# Patient Record
Sex: Female | Born: 1995 | Race: Black or African American | Hispanic: No | Marital: Single | Smoking: Never smoker
Health system: Southern US, Community
[De-identification: ages and names within clinical notes are randomized; demographics above are authoritative.]

## PROBLEM LIST (undated history)

## (undated) ENCOUNTER — Inpatient Hospital Stay (HOSPITAL_COMMUNITY): Payer: Self-pay

## (undated) DIAGNOSIS — R51 Headache: Secondary | ICD-10-CM

## (undated) DIAGNOSIS — R519 Headache, unspecified: Secondary | ICD-10-CM

## (undated) DIAGNOSIS — R569 Unspecified convulsions: Secondary | ICD-10-CM

## (undated) DIAGNOSIS — F32A Depression, unspecified: Secondary | ICD-10-CM

## (undated) DIAGNOSIS — I499 Cardiac arrhythmia, unspecified: Secondary | ICD-10-CM

## (undated) DIAGNOSIS — F329 Major depressive disorder, single episode, unspecified: Secondary | ICD-10-CM

## (undated) DIAGNOSIS — F419 Anxiety disorder, unspecified: Secondary | ICD-10-CM

---

## 2015-07-26 NOTE — L&D Delivery Note (Signed)
Delivery Note At 6:02 PM a viable female was delivered via  (Presentation:vertex ; LOA ).  APGAR: , ; weight pending .   Placenta status:spontaneous ,shultz .  Cord: 3VC with the following complications:none .  Cord pH: pending  Anesthesia: none  Episiotomy:  none Lacerations:  1 degree laceration w/ no repair  Suture Repair: n/a Est. Blood Loss (mL):  100  Mom to postpartum.  Baby to Couplet care / Skin to Skin.  Sheila Villanueva 05/08/2016, 6:17 PM

## 2016-02-19 ENCOUNTER — Inpatient Hospital Stay (HOSPITAL_COMMUNITY)
Admission: AD | Admit: 2016-02-19 | Discharge: 2016-02-19 | Disposition: A | Discharge status: Home / Self Care | Payer: Medicaid Other | Source: Ambulatory Visit | Attending: Obstetrics & Gynecology | Admitting: Obstetrics & Gynecology

## 2016-02-19 ENCOUNTER — Encounter (HOSPITAL_COMMUNITY): Payer: Self-pay | Admitting: *Deleted

## 2016-02-19 DIAGNOSIS — Z3A32 32 weeks gestation of pregnancy: Secondary | ICD-10-CM | POA: Insufficient documentation

## 2016-02-19 DIAGNOSIS — M545 Low back pain: Secondary | ICD-10-CM | POA: Insufficient documentation

## 2016-02-19 DIAGNOSIS — Z888 Allergy status to other drugs, medicaments and biological substances status: Secondary | ICD-10-CM | POA: Insufficient documentation

## 2016-02-19 DIAGNOSIS — Z882 Allergy status to sulfonamides status: Secondary | ICD-10-CM | POA: Insufficient documentation

## 2016-02-19 DIAGNOSIS — M549 Dorsalgia, unspecified: Secondary | ICD-10-CM

## 2016-02-19 DIAGNOSIS — O26893 Other specified pregnancy related conditions, third trimester: Secondary | ICD-10-CM | POA: Diagnosis not present

## 2016-02-19 HISTORY — DX: Headache, unspecified: R51.9

## 2016-02-19 HISTORY — DX: Unspecified convulsions: R56.9

## 2016-02-19 HISTORY — DX: Major depressive disorder, single episode, unspecified: F32.9

## 2016-02-19 HISTORY — DX: Depression, unspecified: F32.A

## 2016-02-19 HISTORY — DX: Headache: R51

## 2016-02-19 HISTORY — DX: Anxiety disorder, unspecified: F41.9

## 2016-02-19 LAB — URINE MICROSCOPIC-ADD ON

## 2016-02-19 LAB — URINALYSIS, ROUTINE W REFLEX MICROSCOPIC
Bilirubin Urine: NEGATIVE
GLUCOSE, UA: NEGATIVE mg/dL
KETONES UR: NEGATIVE mg/dL
LEUKOCYTES UA: NEGATIVE
Nitrite: NEGATIVE
PH: 7 (ref 5.0–8.0)
Protein, ur: NEGATIVE mg/dL
Specific Gravity, Urine: 1.02 (ref 1.005–1.030)

## 2016-02-19 NOTE — Discharge Instructions (Signed)

## 2016-02-19 NOTE — MAU Provider Note (Signed)
  History     CSN: [id number]  Arrival date and time: 02/19/16 1301   None     Chief Complaint  Patient presents with  . Back Pain   HPI Sheila Villanueva is a Garnette Gunner @ 32.1wks who presents for eval of low abd pressure x this week and low back pain 'for a long time'. She denies leaking or bldg. No fever or dysuria. Reports +FM. No N/V/D. She has recently moved here from [place] and is working on establishing care here.  OB History    Gravida Para Term Preterm AB Living   1             SAB TAB Ectopic Multiple Live Births                  Past Medical History:  Diagnosis Date  . Anxiety    stopped medication on her own  . Depression   . Headache   . Seizures (HCC)    reports seizure like activity in July 2017    History reviewed. No pertinent surgical history.  Family History  Problem Relation Age of Onset  . Adopted: Yes    Social History  Substance Use Topics  . Smoking status: Never Smoker  . Smokeless tobacco: Never Used  . Alcohol use No    Allergies:  Allergies  Allergen Reactions  . Tetracyclines & Related Nausea And Vomiting  . Sulfa Antibiotics Rash    Prescriptions Prior to Admission  Medication Sig Dispense Refill Last Dose  . ferrous sulfate 325 (65 FE) MG tablet Take 325 mg by mouth daily with breakfast.   02/18/2016 at Unknown time  . Prenatal Vit-Fe Fumarate-FA (PRENATAL MULTIVITAMIN) TABS tablet Take 1 tablet by mouth daily at 12 noon.   02/18/2016 at Unknown time    ROS No other pertinents other than what is listed in HPI Physical Exam   Blood pressure 97/59, pulse 104, temperature 98.7 F (37.1 C), temperature source Oral, resp. rate 18.  Physical Exam  Constitutional: She is oriented to person, place, and time. She appears well-developed.  HENT:  Head: Normocephalic.  Neck: Normal range of motion.  Cardiovascular: Normal rate.   Respiratory: Effort normal.  GI:  EFM 145-150s, +accels no decels UI  Genitourinary: Vagina normal.   Genitourinary Comments: Cx C/L  Musculoskeletal: Normal range of motion.  Neurological: She is alert and oriented to person, place, and time.  Skin: Skin is warm and dry.  Psychiatric: She has a normal mood and affect. Her behavior is normal. Thought content normal.   Urinalysis    Component Value Date/Time   COLORURINE YELLOW 02/19/2016 1315   APPEARANCEUR CLEAR 02/19/2016 1315   LABSPEC 1.020 02/19/2016 1315   PHURINE 7.0 02/19/2016 1315   GLUCOSEU NEGATIVE 02/19/2016 1315   HGBUR MODERATE (A) 02/19/2016 1315   BILIRUBINUR NEGATIVE 02/19/2016 1315   KETONESUR NEGATIVE 02/19/2016 1315   PROTEINUR NEGATIVE 02/19/2016 1315   NITRITE NEGATIVE 02/19/2016 1315   LEUKOCYTESUR NEGATIVE 02/19/2016 1315   Micro: 6-30SE, many bacteria, mucous +  MAU Course  Procedures  MDM UA ordered NST read Cx exam  Assessment and Plan  IUP@32 .1wks Back pain in preg  D/C home Given number for WOC to call ASAP for care (bring previous records to that visit) Comfort meas rev'd  Cam Hai CNM 02/19/2016, 2:41 PM

## 2016-02-19 NOTE — MAU Note (Signed)
Low back pain and pressure for the past wk.  The pressure has become constant, the pressure comes nd goes.  No hx of PTL.

## 2016-03-24 ENCOUNTER — Encounter: Payer: Self-pay | Admitting: *Deleted

## 2016-04-12 ENCOUNTER — Encounter (HOSPITAL_COMMUNITY): Payer: Self-pay

## 2016-04-12 ENCOUNTER — Inpatient Hospital Stay (HOSPITAL_COMMUNITY)
Admission: AD | Admit: 2016-04-12 | Discharge: 2016-04-12 | Disposition: A | Discharge status: Home / Self Care | Payer: Medicaid Other | Source: Ambulatory Visit | Attending: Obstetrics and Gynecology | Admitting: Obstetrics and Gynecology

## 2016-04-12 DIAGNOSIS — O98819 Other maternal infectious and parasitic diseases complicating pregnancy, unspecified trimester: Secondary | ICD-10-CM

## 2016-04-12 DIAGNOSIS — O471 False labor at or after 37 completed weeks of gestation: Secondary | ICD-10-CM

## 2016-04-12 DIAGNOSIS — O0930 Supervision of pregnancy with insufficient antenatal care, unspecified trimester: Secondary | ICD-10-CM

## 2016-04-12 DIAGNOSIS — Z3A37 37 weeks gestation of pregnancy: Secondary | ICD-10-CM

## 2016-04-12 DIAGNOSIS — A749 Chlamydial infection, unspecified: Secondary | ICD-10-CM | POA: Clinically undetermined

## 2016-04-12 DIAGNOSIS — R7309 Other abnormal glucose: Secondary | ICD-10-CM

## 2016-04-12 DIAGNOSIS — O0933 Supervision of pregnancy with insufficient antenatal care, third trimester: Secondary | ICD-10-CM | POA: Diagnosis not present

## 2016-04-12 DIAGNOSIS — O479 False labor, unspecified: Secondary | ICD-10-CM

## 2016-04-12 LAB — URINALYSIS, ROUTINE W REFLEX MICROSCOPIC
Bilirubin Urine: NEGATIVE
GLUCOSE, UA: NEGATIVE mg/dL
KETONES UR: NEGATIVE mg/dL
Nitrite: NEGATIVE
PROTEIN: NEGATIVE mg/dL
Specific Gravity, Urine: 1.015 (ref 1.005–1.030)
pH: 6.5 (ref 5.0–8.0)

## 2016-04-12 LAB — URINE MICROSCOPIC-ADD ON

## 2016-04-12 LAB — RAPID URINE DRUG SCREEN, HOSP PERFORMED
Amphetamines: NOT DETECTED
BENZODIAZEPINES: NOT DETECTED
Barbiturates: NOT DETECTED
Cocaine: NOT DETECTED
Opiates: NOT DETECTED
Tetrahydrocannabinol: NOT DETECTED

## 2016-04-12 LAB — OB RESULTS CONSOLE GBS: STREP GROUP B AG: NEGATIVE

## 2016-04-12 NOTE — Discharge Instructions (Signed)

## 2016-04-12 NOTE — MAU Provider Note (Signed)
[facility]  [facility]   First Provider Initiated Contact with Patient 04/12/16 1805     HPI  HPI: Sheila Villanueva is a 20 y.o. G1P0 at 6237w2dwho presents to maternity admissions reporting lapse in prenatal care.  States is full term and wants to know what her cervix is doing. Had prenatal care in [place] until 2 months ago, records scanned.  No complaints, except some irregular, mild contractions.  Just wants to "be checked"  States has gotten two due dates.  Per record review, the one we are using is by a 7 week US, therefore accurate. She reports good fetal movement, denies LOF, vaginal bleeding, vaginal itching/burning, urinary symptoms, h/a, dizziness, n/v, diarrhea, constipation or fever/chills.  She denies headache, visual changes or RUQ abdominal pain.  RN Note: Pt moved here from [place], hasn't had any PNC since moving here 2 months ago.  Denies bleeding or LOF, has pelvic pressure & back pain.  Past Medical History: Past Medical History:  Diagnosis Date  . Anxiety    stopped medication on her own  . Depression   . Headache   . Seizures (HCC)    reports seizure like activity in July 2017    Past obstetric history: OB History  Gravida Para Term Preterm AB Living  1            SAB TAB Ectopic Multiple Live Births               # Outcome Date GA Lbr Len/2nd Weight Sex Delivery Anes PTL Lv  1 Current               Past Surgical History: History reviewed. No pertinent surgical history.  Family History: Family History  Problem Relation Age of Onset  . Adopted: Yes    Social History: Social History  Substance Use Topics  . Smoking status: Never Smoker  . Smokeless tobacco: Never Used  . Alcohol use No    Allergies:  Allergies  Allergen Reactions  . Tetracyclines & Related Nausea And Vomiting  . Sulfa Antibiotics Rash    Meds:  Prescriptions Prior to Admission  Medication Sig Dispense Refill Last Dose  . ferrous sulfate 325 (65 FE) MG tablet Take 325 mg by  mouth daily with breakfast.   02/18/2016 at Unknown time  . Prenatal Vit-Fe Fumarate-FA (PRENATAL MULTIVITAMIN) TABS tablet Take 1 tablet by mouth daily at 12 noon.   02/18/2016 at Unknown time    I have reviewed patient's Past Medical Hx, Surgical Hx, Family Hx, Social Hx, medications and allergies.   ROS:  Review of Systems  Constitutional: Negative for chills and fever.  Gastrointestinal: Negative for abdominal pain, constipation, diarrhea, nausea and vomiting.  Genitourinary: Negative for vaginal bleeding and vaginal discharge.  Musculoskeletal: Negative for back pain.  Neurological: Negative for dizziness.   Other systems negative  Physical Exam  Patient Vitals for the past 24 hrs:  BP Temp Temp src Pulse Resp  04/12/16 1710 120/64 98.4 F (36.9 C) Oral 116 16   Constitutional: Well-developed, well-nourished female in no acute distress.  Cardiovascular: normal rate and rhythm Respiratory: normal effort, clear to auscultation bilaterally GI: Abd soft, non-tender, gravid appropriate for gestational age.   No rebound or guarding. MS: Extremities nontender, no edema, normal ROM Neurologic: Alert and oriented x 4.  GU: Neg CVAT.   Dilation: Closed Effacement (%): Thick Cervical Position: Posterior Station: Ballotable Presentation: Vertex Exam by:: Mayford KnifeWilliams CNM     FHT:  Baseline 140 ,  moderate variability, accelerations present, no decelerations Contractions: Irregular     Labs: Results for orders placed or performed during the hospital encounter of 04/12/16 (from the past 72 hour(s))  Urinalysis, Routine w reflex microscopic (not at [facility])     Status: Abnormal   Collection Time: 04/12/16  5:15 PM  Result Value Ref Range   Color, Urine YELLOW YELLOW   APPearance CLEAR CLEAR   Specific Gravity, Urine 1.015 1.005 - 1.030   pH 6.5 5.0 - 8.0   Glucose, UA NEGATIVE NEGATIVE mg/dL   Hgb urine dipstick MODERATE (A) NEGATIVE   Bilirubin Urine NEGATIVE NEGATIVE   Ketones, ur  NEGATIVE NEGATIVE mg/dL   Protein, ur NEGATIVE NEGATIVE mg/dL   Nitrite NEGATIVE NEGATIVE   Leukocytes, UA SMALL (A) NEGATIVE  Urine rapid drug screen (hosp performed)     Status: None   Collection Time: 04/12/16  5:15 PM  Result Value Ref Range   Opiates NONE DETECTED NONE DETECTED   Cocaine NONE DETECTED NONE DETECTED   Benzodiazepines NONE DETECTED NONE DETECTED   Amphetamines NONE DETECTED NONE DETECTED   Tetrahydrocannabinol NONE DETECTED NONE DETECTED   Barbiturates NONE DETECTED NONE DETECTED  Urine microscopic-add on     Status: Abnormal   Collection Time: 04/12/16  5:15 PM  Result Value Ref Range   Squamous Epithelial / LPF 0-5 (A) NONE SEEN   WBC, UA 0-5 0 - 5 WBC/hpf   RBC / HPF 0-5 0 - 5 RBC/hpf   Bacteria, UA RARE (A) NONE SEEN  Culture, beta strep (group b only)     Status: None (Preliminary result)   Collection Time: 04/12/16  6:39 PM  Result Value Ref Range   Specimen Description VAGINAL/RECTAL    Special Requests NONE    Culture      CULTURE REINCUBATED FOR BETTER GROWTH Performed at [facility]    Report Status PENDING     Imaging:  No results found.  MAU Course/MDM: I have ordered labs and reviewed results.  NST reviewed GBS collected since she is so close to term Probably too late to get 3 hour GTT.  Per record review, glucola was 137, no evidence of 3 hr test  Assessment: SIUP at [redacted]w[redacted]d Lapse in prenatal care Reassuring FHR  Tracing, Category I Plan: Discharge home Labor precautions and fetal kick counts Follow up in Office for prenatal visits and recheck at [facility] (message sent)   Pt stable at time of discharge. Encouraged to return here or to other Urgent Care/ED if she develops worsening of symptoms, increase in pain, fever, or other concerning symptoms.     Wynelle Bourgeois CNM, MSN Certified Nurse-Midwife 04/12/2016 6:05 PM

## 2016-04-12 NOTE — MAU Note (Signed)
Pt moved here from [place], hasn't had any PNC since moving here 2 months ago.  Denies bleeding or LOF, has pelvic pressure & back pain.

## 2016-04-13 ENCOUNTER — Telehealth: Payer: Self-pay

## 2016-04-13 ENCOUNTER — Encounter (HOSPITAL_COMMUNITY): Payer: Self-pay | Admitting: Advanced Practice Midwife

## 2016-04-13 NOTE — Telephone Encounter (Signed)
Left message for patient to call our [place] office to schedule OB appointment. Armandina StammerJennifer Howard RNBSN

## 2016-04-14 LAB — CULTURE, BETA STREP (GROUP B ONLY)

## 2016-05-03 ENCOUNTER — Inpatient Hospital Stay (HOSPITAL_COMMUNITY)
Admission: AD | Admit: 2016-05-03 | Discharge: 2016-05-03 | Disposition: A | Discharge status: Home / Self Care | Payer: Medicaid Other | Source: Ambulatory Visit | Attending: Obstetrics and Gynecology | Admitting: Obstetrics and Gynecology

## 2016-05-03 ENCOUNTER — Encounter (HOSPITAL_COMMUNITY): Payer: Self-pay

## 2016-05-03 DIAGNOSIS — R109 Unspecified abdominal pain: Secondary | ICD-10-CM | POA: Diagnosis not present

## 2016-05-03 DIAGNOSIS — O9989 Other specified diseases and conditions complicating pregnancy, childbirth and the puerperium: Secondary | ICD-10-CM | POA: Diagnosis not present

## 2016-05-03 DIAGNOSIS — O0933 Supervision of pregnancy with insufficient antenatal care, third trimester: Secondary | ICD-10-CM | POA: Diagnosis not present

## 2016-05-03 DIAGNOSIS — R7309 Other abnormal glucose: Secondary | ICD-10-CM

## 2016-05-03 DIAGNOSIS — O1203 Gestational edema, third trimester: Secondary | ICD-10-CM | POA: Insufficient documentation

## 2016-05-03 DIAGNOSIS — Z3A40 40 weeks gestation of pregnancy: Secondary | ICD-10-CM | POA: Insufficient documentation

## 2016-05-03 NOTE — MAU Provider Note (Signed)
History     CSN: [id number]  Arrival date and time: 05/03/16 1742      Chief Complaint  Patient presents with  . Abdominal Pain   HPI  A 20 year old G1 P0 at 2040 and 2 x 7 week ultrasound who presents today with concerns for preeclampsia after googling her symptoms. Patient [facility] until [facility] [place]. She is since attempted to transfer care here but has been unable to establish with the physician. All of her records have been scanned into the computer.  Her concerning symptoms today were swelling and a remote event of questionable seizure in July. She has had no similar episodes since then. She states she was seen at the hospital, but they are unsure if she had a seizure. She was just given fluids and sent home. She has no chronic medical problems.   OB History    Gravida Para Term Preterm AB Living   1             SAB TAB Ectopic Multiple Live Births                  Past Medical History:  Diagnosis Date  . Anxiety    stopped medication on her own  . Depression   . Headache   . Seizures (HCC)    reports seizure like activity in July 2017    History reviewed. No pertinent surgical history.  Family History  Problem Relation Age of Onset  . Adopted: Yes    Social History  Substance Use Topics  . Smoking status: Never Smoker  . Smokeless tobacco: Never Used  . Alcohol use No    Allergies:  Allergies  Allergen Reactions  . Tetracyclines & Related Nausea And Vomiting  . Sulfa Antibiotics Rash    Prescriptions Prior to Admission  Medication Sig Dispense Refill Last Dose  . ferrous sulfate 325 (65 FE) MG tablet Take 325 mg by mouth daily with breakfast.   02/18/2016 at Unknown time  . Prenatal Vit-Fe Fumarate-FA (PRENATAL MULTIVITAMIN) TABS tablet Take 1 tablet by mouth daily at 12 noon.   02/18/2016 at Unknown time    Review of Systems  Constitutional: Negative for chills and fever.  HENT: Negative for  congestion.   Eyes: Negative for blurred vision, double vision and photophobia.  Respiratory: Negative for cough and hemoptysis.   Cardiovascular: Negative for chest pain and palpitations.  Gastrointestinal: Negative for abdominal pain, heartburn, nausea and vomiting.  Genitourinary: Negative for dysuria, frequency and urgency.  Musculoskeletal: Negative for back pain, myalgias and neck pain.  Skin: Negative for itching and rash.  Neurological: Negative for dizziness, tingling, tremors and headaches.  Endo/Heme/Allergies: Negative for environmental allergies. Does not bruise/bleed easily.  Psychiatric/Behavioral: Negative for depression and suicidal ideas.   Physical Exam   Blood pressure 111/60, pulse 117, temperature 98.4 F (36.9 C), resp. rate 16, last menstrual period 07/09/2015.  Physical Exam  Constitutional: She is oriented to person, place, and time. She appears well-developed and well-nourished.  Cardiovascular: Normal rate and intact distal pulses.   Respiratory: Effort normal and breath sounds normal. No respiratory distress.  GI: Soft. Bowel sounds are normal. She exhibits no distension. There is no tenderness. There is no rebound.  gravid  Genitourinary:  Genitourinary Comments: Cervix 3/50/-3  Neurological: She is alert and oriented to person, place, and time.  Skin: Skin is warm and dry.   Reactive NST Baseline HR around 145-150 throughout her stay.  MAU Course  Procedures  MDM IN the MAU pt was monitored. BP was WNL. Patient was reassured that she had no significant concern for pre-eclampsia and she was discharged home.   Assessment and Plan  Limited prenatal care: records reviewed. No concerns. Plan for induction at 41 wks. Induction shceduled for 05/08/2016  Ernestina Penna 05/03/2016, 6:43 PM

## 2016-05-03 NOTE — Discharge Instructions (Signed)

## 2016-05-03 NOTE — MAU Note (Signed)
Having abdominal pain and pressure. Has not had care since she moved here in July, has tried to make appointments but has not been able to get into any clinics. Had a seizure in July. Has had some headaches and visual changes recently.

## 2016-05-06 ENCOUNTER — Encounter (HOSPITAL_COMMUNITY): Payer: Self-pay | Admitting: *Deleted

## 2016-05-06 ENCOUNTER — Inpatient Hospital Stay (HOSPITAL_COMMUNITY)
Admission: AD | Admit: 2016-05-06 | Discharge: 2016-05-06 | Disposition: A | Discharge status: Home / Self Care | Payer: Medicaid Other | Source: Ambulatory Visit | Attending: Obstetrics & Gynecology | Admitting: Obstetrics & Gynecology

## 2016-05-06 ENCOUNTER — Telehealth (HOSPITAL_COMMUNITY): Payer: Self-pay | Admitting: *Deleted

## 2016-05-06 DIAGNOSIS — Z3A41 41 weeks gestation of pregnancy: Secondary | ICD-10-CM | POA: Insufficient documentation

## 2016-05-06 DIAGNOSIS — R7309 Other abnormal glucose: Secondary | ICD-10-CM

## 2016-05-06 DIAGNOSIS — Z3493 Encounter for supervision of normal pregnancy, unspecified, third trimester: Secondary | ICD-10-CM | POA: Diagnosis not present

## 2016-05-06 NOTE — Discharge Instructions (Signed)
Braxton Hicks Contractions °Contractions of the uterus can occur throughout pregnancy. Contractions are not always a sign that you are in labor.  °WHAT ARE BRAXTON HICKS CONTRACTIONS?  °Contractions that occur before labor are called Braxton Hicks contractions, or false labor. Toward the end of pregnancy (32-34 weeks), these contractions can develop more often and may become more forceful. This is not true labor because these contractions do not result in opening (dilatation) and thinning of the cervix. They are sometimes difficult to tell apart from true labor because these contractions can be forceful and people have different pain tolerances. You should not feel embarrassed if you go to the hospital with false labor. Sometimes, the only way to tell if you are in true labor is for your health care provider to look for changes in the cervix. °If there are no prenatal problems or other health problems associated with the pregnancy, it is completely safe to be sent home with false labor and await the onset of true labor. °HOW CAN YOU TELL THE DIFFERENCE BETWEEN TRUE AND FALSE LABOR? °False Labor °· The contractions of false labor are usually shorter and not as hard as those of true labor.   °· The contractions are usually irregular.   °· The contractions are often felt in the front of the lower abdomen and in the groin.   °· The contractions may go away when you walk around or change positions while lying down.   °· The contractions get weaker and are shorter lasting as time goes on.   °· The contractions do not usually become progressively stronger, regular, and closer together as with true labor.   °True Labor °· Contractions in true labor last 30-70 seconds, become very regular, usually become more intense, and increase in frequency.   °· The contractions do not go away with walking.   °· The discomfort is usually felt in the top of the uterus and spreads to the lower abdomen and low back.   °· True labor can be  determined by your health care provider with an exam. This will show that the cervix is dilating and getting thinner.   °WHAT TO REMEMBER °· Keep up with your usual exercises and follow other instructions given by your health care provider.   °· Take medicines as directed by your health care provider.   °· Keep your regular prenatal appointments.   °· Eat and drink lightly if you think you are going into labor.   °· If Braxton Hicks contractions are making you uncomfortable:   °¨ Change your position from lying down or resting to walking, or from walking to resting.   °¨ Sit and rest in a tub of warm water.   °¨ Drink 2-3 glasses of water. Dehydration may cause these contractions.   °¨ Do slow and deep breathing several times an hour.   °WHEN SHOULD I SEEK IMMEDIATE MEDICAL CARE? °Seek immediate medical care if: °· Your contractions become stronger, more regular, and closer together.   °· You have fluid leaking or gushing from your vagina.   °· You have a fever.   °· You pass blood-tinged mucus.   °· You have vaginal bleeding.   °· You have continuous abdominal pain.   °· You have low back pain that you never had before.   °· You feel your baby's head pushing down and causing pelvic pressure.   °· Your baby is not moving as much as it used to.   °  °This information is not intended to replace advice given to you by your health care provider. Make sure you discuss any questions you have with your health care   provider. °  °Document Released: 07/11/2005 Document Revised: 07/16/2013 Document Reviewed: 04/22/2013 °Elsevier Interactive Patient Education ©2016 [employer] ° °

## 2016-05-06 NOTE — Telephone Encounter (Signed)
Preadmission screen  

## 2016-05-06 NOTE — MAU Note (Signed)
PT  SAYS    SHE TRANSFERRED  FROM [facility].     ON  2ND  WEEK OF  July  -   SHE  TRIED  TO GET PNC-  BUT  WHEN  SHE  CALLED  AROUND  NOONE  WOULD  TAKE  HER.  SHE  DOES  NOT HAVE  RECORDS  WITH  HER .    SHE WAS IN MAU    2-3  DAYS.   SHE IS SCH FOR AN INDUCTION ON  Sunday  AM  - AT 0800.      LAST MAU VISIT     3  CM.       DENIES HSV AND MRSA.

## 2016-05-06 NOTE — MAU Note (Signed)
Urine sent to lab 

## 2016-05-08 ENCOUNTER — Inpatient Hospital Stay (HOSPITAL_COMMUNITY)
Admission: AD | Admit: 2016-05-08 | Discharge: 2016-05-10 | DRG: 775 | Disposition: A | Discharge status: Home / Self Care | Payer: Medicaid Other | Source: Ambulatory Visit | Attending: Obstetrics and Gynecology | Admitting: Obstetrics and Gynecology

## 2016-05-08 ENCOUNTER — Inpatient Hospital Stay (HOSPITAL_COMMUNITY): Admit: 2016-05-08 | Payer: Medicaid Other | Attending: Obstetrics and Gynecology | Admitting: Obstetrics and Gynecology

## 2016-05-08 ENCOUNTER — Encounter (HOSPITAL_COMMUNITY): Payer: Self-pay

## 2016-05-08 DIAGNOSIS — O480 Post-term pregnancy: Secondary | ICD-10-CM | POA: Diagnosis present

## 2016-05-08 DIAGNOSIS — Z8759 Personal history of other complications of pregnancy, childbirth and the puerperium: Secondary | ICD-10-CM

## 2016-05-08 DIAGNOSIS — Z3A41 41 weeks gestation of pregnancy: Secondary | ICD-10-CM

## 2016-05-08 DIAGNOSIS — R7309 Other abnormal glucose: Secondary | ICD-10-CM

## 2016-05-08 HISTORY — DX: Cardiac arrhythmia, unspecified: I49.9

## 2016-05-08 LAB — TYPE AND SCREEN
ABO/RH(D): O POS
Antibody Screen: NEGATIVE

## 2016-05-08 LAB — RAPID URINE DRUG SCREEN, HOSP PERFORMED
Amphetamines: NOT DETECTED
BARBITURATES: NOT DETECTED
BENZODIAZEPINES: NOT DETECTED
Cocaine: NOT DETECTED
Opiates: NOT DETECTED
Tetrahydrocannabinol: NOT DETECTED

## 2016-05-08 LAB — CBC
HCT: 32.2 % — ABNORMAL LOW (ref 36.0–46.0)
HEMOGLOBIN: 10.6 g/dL — AB (ref 12.0–15.0)
MCH: 28.8 pg (ref 26.0–34.0)
MCHC: 32.9 g/dL (ref 30.0–36.0)
MCV: 87.5 fL (ref 78.0–100.0)
Platelets: 232 10*3/uL (ref 150–400)
RBC: 3.68 MIL/uL — AB (ref 3.87–5.11)
RDW: 15.3 % (ref 11.5–15.5)
WBC: 8.5 10*3/uL (ref 4.0–10.5)

## 2016-05-08 LAB — ABO/RH: ABO/RH(D): O POS

## 2016-05-08 LAB — HEPATITIS B SURFACE ANTIGEN: HEP B S AG: NEGATIVE

## 2016-05-08 MED ORDER — HYDROXYZINE HCL 50 MG PO TABS
50.0000 mg | ORAL_TABLET | Freq: Four times a day (QID) | ORAL | Status: DC | PRN
  Filled 2016-05-08: qty 1

## 2016-05-08 MED ORDER — DIPHENHYDRAMINE HCL 25 MG PO CAPS: 25.0000 mg | ORAL_CAPSULE | Freq: Four times a day (QID) | ORAL | Status: DC | PRN

## 2016-05-08 MED ORDER — ONDANSETRON HCL 4 MG PO TABS: 4.0000 mg | ORAL_TABLET | ORAL | Status: DC | PRN

## 2016-05-08 MED ORDER — ONDANSETRON HCL 4 MG/2ML IJ SOLN
4.0000 mg | Freq: Four times a day (QID) | INTRAMUSCULAR | Status: DC | PRN
  Administered 2016-05-08: 4 mg via INTRAVENOUS
  Filled 2016-05-08: qty 2

## 2016-05-08 MED ORDER — ZOLPIDEM TARTRATE 5 MG PO TABS: 5.0000 mg | ORAL_TABLET | Freq: Every evening | ORAL | Status: DC | PRN

## 2016-05-08 MED ORDER — ACETAMINOPHEN 325 MG PO TABS: 650.0000 mg | ORAL_TABLET | ORAL | Status: DC | PRN

## 2016-05-08 MED ORDER — PRENATAL MULTIVITAMIN CH
1.0000 | ORAL_TABLET | Freq: Every day | ORAL | Status: DC
  Administered 2016-05-09: 1 via ORAL
  Filled 2016-05-08: qty 1

## 2016-05-08 MED ORDER — IBUPROFEN 600 MG PO TABS
600.0000 mg | ORAL_TABLET | Freq: Four times a day (QID) | ORAL | Status: DC
  Administered 2016-05-08 – 2016-05-10 (×7): 600 mg via ORAL
  Filled 2016-05-08 (×7): qty 1

## 2016-05-08 MED ORDER — SIMETHICONE 80 MG PO CHEW: 80.0000 mg | CHEWABLE_TABLET | ORAL | Status: DC | PRN

## 2016-05-08 MED ORDER — TETANUS-DIPHTH-ACELL PERTUSSIS 5-2.5-18.5 LF-MCG/0.5 IM SUSP: 0.5000 mL | Freq: Once | INTRAMUSCULAR | Status: DC

## 2016-05-08 MED ORDER — LACTATED RINGERS IV SOLN: 500.0000 mL | INTRAVENOUS | Status: DC | PRN

## 2016-05-08 MED ORDER — ONDANSETRON HCL 4 MG/2ML IJ SOLN
4.0000 mg | INTRAMUSCULAR | Status: DC | PRN
Start: 2016-05-08 — End: 2016-05-10

## 2016-05-08 MED ORDER — SODIUM CHLORIDE 0.9% FLUSH: 3.0000 mL | INTRAVENOUS | Status: DC | PRN

## 2016-05-08 MED ORDER — OXYCODONE-ACETAMINOPHEN 5-325 MG PO TABS
1.0000 | ORAL_TABLET | ORAL | Status: DC | PRN
Start: 2016-05-08 — End: 2016-05-08

## 2016-05-08 MED ORDER — SENNOSIDES-DOCUSATE SODIUM 8.6-50 MG PO TABS
2.0000 | ORAL_TABLET | ORAL | Status: DC
  Administered 2016-05-09: 2 via ORAL
  Filled 2016-05-08 (×2): qty 2

## 2016-05-08 MED ORDER — MEASLES, MUMPS & RUBELLA VAC ~~LOC~~ INJ
0.5000 mL | INJECTION | Freq: Once | SUBCUTANEOUS | Status: DC
  Filled 2016-05-08: qty 0.5

## 2016-05-08 MED ORDER — OXYTOCIN 10 UNIT/ML IJ SOLN: 10.0000 [IU] | Freq: Once | INTRAMUSCULAR | Status: DC

## 2016-05-08 MED ORDER — OXYTOCIN BOLUS FROM INFUSION: 500.0000 mL | Freq: Once | INTRAVENOUS | Status: DC

## 2016-05-08 MED ORDER — OXYTOCIN 40 UNITS IN LACTATED RINGERS INFUSION - SIMPLE MED: 2.5000 [IU]/h | INTRAVENOUS | Status: DC

## 2016-05-08 MED ORDER — FLEET ENEMA 7-19 GM/118ML RE ENEM: 1.0000 | ENEMA | Freq: Every day | RECTAL | Status: DC | PRN

## 2016-05-08 MED ORDER — OXYTOCIN 40 UNITS IN LACTATED RINGERS INFUSION - SIMPLE MED
1.0000 m[IU]/min | INTRAVENOUS | Status: DC
  Administered 2016-05-08: 2 m[IU]/min via INTRAVENOUS
  Filled 2016-05-08: qty 1000

## 2016-05-08 MED ORDER — LACTATED RINGERS IV SOLN
INTRAVENOUS | Status: DC
  Administered 2016-05-08 (×2): via INTRAVENOUS

## 2016-05-08 MED ORDER — SODIUM CHLORIDE 0.9% FLUSH: 3.0000 mL | Freq: Two times a day (BID) | INTRAVENOUS | Status: DC

## 2016-05-08 MED ORDER — BENZOCAINE-MENTHOL 20-0.5 % EX AERO: 1.0000 "application " | INHALATION_SPRAY | CUTANEOUS | Status: DC | PRN

## 2016-05-08 MED ORDER — MISOPROSTOL 200 MCG PO TABS
ORAL_TABLET | ORAL | Status: AC
  Filled 2016-05-08: qty 4

## 2016-05-08 MED ORDER — LIDOCAINE HCL (PF) 1 % IJ SOLN
30.0000 mL | INTRAMUSCULAR | Status: DC | PRN
  Filled 2016-05-08: qty 30

## 2016-05-08 MED ORDER — FENTANYL CITRATE (PF) 100 MCG/2ML IJ SOLN
50.0000 ug | INTRAMUSCULAR | Status: DC | PRN
  Administered 2016-05-08 (×2): 100 ug via INTRAVENOUS
  Filled 2016-05-08 (×2): qty 2

## 2016-05-08 MED ORDER — ACETAMINOPHEN 325 MG PO TABS
650.0000 mg | ORAL_TABLET | ORAL | Status: DC | PRN
  Administered 2016-05-09 – 2016-05-10 (×3): 650 mg via ORAL
  Filled 2016-05-08 (×3): qty 2

## 2016-05-08 MED ORDER — OXYCODONE-ACETAMINOPHEN 5-325 MG PO TABS: 2.0000 | ORAL_TABLET | ORAL | Status: DC | PRN

## 2016-05-08 MED ORDER — SOD CITRATE-CITRIC ACID 500-334 MG/5ML PO SOLN: 30.0000 mL | ORAL | Status: DC | PRN

## 2016-05-08 MED ORDER — SODIUM CHLORIDE 0.9 % IV SOLN: 250.0000 mL | INTRAVENOUS | Status: DC | PRN

## 2016-05-08 MED ORDER — TERBUTALINE SULFATE 1 MG/ML IJ SOLN
0.2500 mg | Freq: Once | INTRAMUSCULAR | Status: DC | PRN
  Filled 2016-05-08: qty 1

## 2016-05-08 MED ORDER — COCONUT OIL OIL: 1.0000 "application " | TOPICAL_OIL | Status: DC | PRN

## 2016-05-08 MED ORDER — MISOPROSTOL 200 MCG PO TABS
800.0000 ug | ORAL_TABLET | Freq: Once | ORAL | Status: AC
  Administered 2016-05-08: 800 ug via RECTAL

## 2016-05-08 NOTE — Progress Notes (Addendum)
Holland FallingKendra E Villanueva is a 20 y.o. G1P0 at 662w0d by ultrasound admitted for induction of labor due to Post dates. Due date 05/01/16.  Subjective:   Objective: BP 129/90   Pulse 84   Temp 98.1 F (36.7 C) (Oral)   Resp (!) 24   Ht 5\' 6"  (1.676 m)   Wt 240 lb (108.9 kg)   LMP 07/09/2015   SpO2 97%   BMI 38.74 kg/m  No intake/output data recorded. No intake/output data recorded.  FHT:  FHR: 100-120's bpm, variability: moderate,  accelerations:  Present,  decelerations:  Present prolonged deccel noted x 4 mins  UC:   regular, every 2-4 minutes SVE:   Dilation: 9 Effacement (%): 90 Station: 0, +1 Exam by:: Marlynn Perking. Sheila Villanueva, CNM  Labs: Lab Results  Component Value Date   WBC 8.5 05/08/2016   HGB 10.6 (L) 05/08/2016   HCT 32.2 (L) 05/08/2016   MCV 87.5 05/08/2016   PLT 232 05/08/2016    Assessment / Plan: Induction of labor due to postterm,  progressing well on pitocin  Labor: Progressing normally; foul smelling odor noted to fluid, thick mec  Preeclampsia:  no signs or symptoms of toxicity, intake and ouput balanced and labs stable Fetal Wellbeing:  Category II Pain Control:  IV pain meds I/D:  n/a Anticipated MOD:  NSVD  Sheila Villanueva 05/08/2016, 2:05 PM

## 2016-05-08 NOTE — H&P (Signed)
Holland FallingKendra E Capobianco is a 20 y.o. female  @ 41.0 weeks G1P0 presenting for IOL. OB History    Gravida Para Term Preterm AB Living   1             SAB TAB Ectopic Multiple Live Births                 Past Medical History:  Diagnosis Date  . Anxiety    stopped medication on her own  . Depression   . Dysrhythmia    pt states prolonged QT in past  . Headache   . Seizures (HCC)    reports seizure like activity in July 2017   History reviewed. No pertinent surgical history. Family History: family history is not on file. She was adopted. Social History:  reports that she has never smoked. She has never used smokeless tobacco. She reports that she does not drink alcohol or use drugs.     Maternal Diabetes: No Genetic Screening: Normal Maternal Ultrasounds/Referrals: Normal Fetal Ultrasounds or other Referrals:  None Maternal Substance Abuse:  No Significant Maternal Medications:  None Significant Maternal Lab Results:  None Other Comments:  None  ROS Maternal Medical History:  Reason for admission: IOL for postdates  Fetal activity: Perceived fetal activity is normal.   Last perceived fetal movement was within the past hour.    Prenatal complications: no prenatal complications Prenatal Complications - Diabetes: none.    Dilation: 4.5 Effacement (%): 50 Station: -2 Exam by:: Renetta ChalkAshley Ellis, SNM  Blood pressure 129/90, pulse 93, temperature 98.3 F (36.8 C), temperature source Oral, resp. rate 18, height 5\' 6"  (1.676 m), weight 240 lb (108.9 kg), last menstrual period 07/09/2015. Maternal Exam:  Abdomen: Patient reports no abdominal tenderness. Estimated fetal weight is 7-5.   Fetal presentation: vertex  Introitus: Normal vulva. Normal vagina.  Ferning test: not done.  Nitrazine test: not done. Amniotic fluid character: not assessed.  Pelvis: adequate for delivery.   Cervix: Cervix evaluated by digital exam.     Fetal Exam Fetal Monitor Review: Mode: ultrasound.    Variability: moderate (6-25 bpm).   Pattern: accelerations present and no decelerations.    Fetal State Assessment: Category I - tracings are normal.     Physical Exam  Constitutional: She is oriented to person, place, and time. She appears well-developed and well-nourished.  HENT:  Head: Normocephalic.  Eyes: Conjunctivae are normal. Pupils are equal, round, and reactive to light.  Neck: Normal range of motion. Neck supple.  Cardiovascular: Normal rate and regular rhythm.   Respiratory: Effort normal and breath sounds normal.  GI: Soft. Bowel sounds are normal.  Genitourinary: Vagina normal and uterus normal.  Musculoskeletal: Normal range of motion.  Neurological: She is alert and oriented to person, place, and time. She has normal reflexes.  Skin: Skin is warm and dry.  Psychiatric: She has a normal mood and affect. Her behavior is normal. Judgment and thought content normal.    Prenatal labs: ABO, Rh: --/--/O POS (10/15 1028) Antibody: PENDING (10/15 1028) Rubella:   RPR:    HBsAg:    HIV:    GBS:     Assessment/Plan: Preg @ 41.0 wks, G1P0, IOL for postdates, SVe 4-5/80/-1,-2; will start oxytocin and then AROM   Wyvonnia DuskyMarie Maryhelen Lindler 05/08/2016, 11:03 AM

## 2016-05-08 NOTE — Anesthesia Pain Management Evaluation Note (Signed)
  CRNA Pain Management Visit Note  Patient: Sheila FallingKendra E Staver, 20 y.o., female  "Hello I am a member of the anesthesia team at [facility]. We have an anesthesia team available at all times to provide care throughout the hospital, including epidural management and anesthesia for C-section. I don't know your plan for the delivery whether it a natural birth, water birth, IV sedation, nitrous supplementation, doula or epidural, but we want to meet your pain goals."   1.Was your pain managed to your expectations on prior hospitalizations?   No prior hospitalizations  2.What is your expectation for pain management during this hospitalization?     Labor support without medications  3.How can we help you reach that goal? natural  Record the patient's initial score and the patient's pain goal.   Pain: 10  Pain Goal: 10 [facility] wants you to be able to say your pain was always managed very well.  Rica RecordsICKELTON,Chelsey Kimberley 05/08/2016

## 2016-05-08 NOTE — Consult Note (Signed)
[facility]  Delivery Note:  SVD  05/08/2016  5:52 PM  I was called to the delivery room at the request of the patient's obstetrician Wyvonnia Dusky(Marie Lawson) for MSAF and fetal heart rate decelerations.  PRENATAL HX:  This is a 20 y/o G1P0 at 941 and 0/[redacted] weeks gestation who was admitted today for IOL due to post dates.  No reported pregnancy complications.  AROM x 6.5 hours with thick meconium.  She is GBS negative.      DELIVERY:  Infant had poor tone at delivery with no respiratory effort and HR < 60 bpm.  He did not respond to standard warming, drying and stimulation so after 30 seconds PPV was initiated after oral and nasal suctioning.  He responded to PPV with immediate improvement in HR.  After 30 seconds he was breathing spontaneously with good tone and cry.  Delee suction was performed x1 with only scant amount of return.  A pulse oximeter was applied and O2 saturations remained in target range.  By 5 minutes of age, O2 saturations were 95% in RA.  APGARs 1 and 9.  Exam within normal limits.  After 5 minutes, baby left with nurse to assist parents with skin-to-skin care.   _____________________ Electronically Signed By: Maryan CharLindsey Jaycion Treml, MD Neonatologist

## 2016-05-08 NOTE — Progress Notes (Signed)
Camelia EngK. Rhianna Raulerson, RN took over care of pt

## 2016-05-09 LAB — RPR: RPR: NONREACTIVE

## 2016-05-09 LAB — RUBELLA SCREEN: RUBELLA: 2.33 {index} (ref >0.99)

## 2016-05-09 LAB — HIV ANTIBODY (ROUTINE TESTING W REFLEX): HIV Screen 4th Generation wRfx: NONREACTIVE

## 2016-05-09 NOTE — Progress Notes (Signed)
Post Partum Day 1 Subjective: no complaints, up ad lib, voiding and tolerating PO  Objective: Blood pressure 123/66, pulse 96, temperature 98.8 F (37.1 C), resp. rate 18, height 5\' 6"  (1.676 m), weight 240 lb (108.9 kg), last menstrual period 07/09/2015, SpO2 97 %, unknown if currently breastfeeding.  Physical Exam:  General: alert, cooperative, appears stated age and no distress Lochia: appropriate Uterine Fundus: firm Incision: n/a DVT Evaluation: No evidence of DVT seen on physical exam.   Recent Labs  05/08/16 1028  HGB 10.6*  HCT 32.2*    Assessment/Plan: Plan for discharge tomorrow   LOS: 1 day   Sheila Villanueva 05/09/2016, 7:25 AM

## 2016-05-09 NOTE — Lactation Note (Signed)
This note was copied from a baby's chart. Lactation Consultation Note Initial visit at 17 hours of age.  Mom reports + breast changes during pregnancy.  Mom reports a couple feedings  Mom holding baby awake and swaddled.  LC offered to assist with latch, mom agreeable.  Mom is able to hand express a few drops of colostrum.  LC assisted with STS in cross cradle hold on right breast.  Baby latches well with wide gape, LC encouraged mom to hold breast during feeding.  FOB and mom concerned about baby being able to breath, LC encouraged nose cheeks and chin to breast to allow for a deeper latch.  LC encouraged chin deep into breast and nose "sniff up" position.  Mom instructed to stimulate baby during feeding to keep baby active.  Few swallows noted.  FOB at bedside supportive. [facility] resources given and discussed.  Encouraged to feed with early cues on demand.  Early newborn behavior discussed.  Encouraged parents to keep record of feeding and output.   Mom to call for assist as needed.      Patient Name: Sheila Viann FishKendra Ladson ZOXWR'UToday's Date: 05/09/2016 Reason for consult: Initial assessment   Maternal Data Has patient been taught Hand Expression?: Yes  Feeding Feeding Type: Breast Fed Length of feed: 7 min  LATCH Score/Interventions Latch: Repeated attempts needed to sustain latch, nipple held in mouth throughout feeding, stimulation needed to elicit sucking reflex. Intervention(s): Skin to skin;Teach feeding cues;Waking techniques Intervention(s): Breast compression;Breast massage  Audible Swallowing: A few with stimulation Intervention(s): Skin to skin;Hand expression Intervention(s): Skin to skin;Hand expression  Type of Nipple: Everted at rest and after stimulation  Comfort (Breast/Nipple): Soft / non-tender     Hold (Positioning): Assistance needed to correctly position infant at breast and maintain latch. Intervention(s): Breastfeeding basics reviewed;Support Pillows;Position  options;Skin to skin  LATCH Score: 7  Lactation Tools Discussed/Used WIC Program: No (plans to call)   Consult Status Consult Status: Follow-up Date: 05/10/16 Follow-up type: In-patient    Anthany Thornhill, Arvella MerlesJana Lynn 05/09/2016, 11:49 AM

## 2016-05-09 NOTE — Clinical Social Work Maternal (Signed)
CLINICAL SOCIAL WORK MATERNAL/CHILD NOTE  Patient Details  Name: Sheila Villanueva MRN: [id number] Date of Birth: 12/29/1995  Date:  05/09/2016  Clinical Social Worker Initiating Note:  Laurey Arrow Date/ Time Initiated:  05/09/16/1454     Child's Name:  Sheila Villanueva   Legal Guardian:  Mother   Need for Interpreter:  None   Date of Referral:  05/02/16     Reason for Referral:  Behavioral Health Issues, including SI , Late or No Prenatal Care    Referral Source:  [facility]   Address:  [place] [address]  Phone number:  [phone]   Household Members:  Self, Significant Other   Natural Supports (not living in the home):  Extended Family   Professional Supports: None   Employment: Unemployed   Type of Work:     Education:  9 to 11 years   Museum/gallery curator Resources:  Medicaid   Other Resources:  Physicist, medical , [place] Considerations Which May Impact Care:  None Reported  Strengths:  Home prepared for child , Ability to meet basic needs    Risk Factors/Current Problems:  Mental Health Concerns    Cognitive State:  Alert , Able to Concentrate , Insightful , Linear Thinking    Mood/Affect:  Happy , Bright , Interested , Comfortable    CSW Assessment: CSW met with MOB to complete and assessment for hx of anxiety/depression and limited PNC.  MOB was receptive to meeting with CSW and was polite and inviting. MOB gave CSW permission to meet with MOB while FOB (Saleem Becton Sr.) was present. CSW inquired about MOB's MH hx and MOB acknowledged a hx of anxiety and depression.  MOB reported that since MOB can remember, MOB has had anxiety and depression.  MOB reported that MOB was regulated on medication until about 1 year ago.  MOB communicated that MOB dc the medications because she did not like how the medications made her feel. Per MOB, MOB has had no symptoms or signs for anxiety/depression since discontinuing the  medications; MOB reports overall feelings good. CSW provided MOB with resource information for outpatient counseling and medication management for [place] if a need arises; MOB was receptive. CSW educated MOB about PPD and informed MOB of possible supports and interventions to decrease PPD.  CSW also encouraged MOB to seek medical attention if needed for increased signs and symptoms for PPD. CSW inquired about MOB's barriers for prenatal care; MOB communicated she received PNC while residing in [place], starting at 6 weeks, prior to moving to [facility].   MOB communicated that MOB attempted to located an OBGYN office in [place] that would accept her as a new patient, however, was denied due to MOB being too late in pregnancy.  Per MOB, MOB visited [facility] frequently to receive prenatal care. CSW informed MOB of the hospital's drug screen policy, and informed MOB of the two screenings for the infant. CSW reported to MOB that the infant had a negative UDS, and CSW will follow the infant's cord screen.  MOB communicated MOB was not concerned, and denied utilizing any substance during pregnancy. MOB denied and barriers to infant attending well-baby appointment. CSW reviewed safe sleep, and SIDS. MOB communicated she is knowledgeable about SIDS and thanked CSW for sharing information.  MOB did not have any questions or concerns at this time. CSW thanked MOB for allowing CSW to meet with MOB and FOB.   CSW Plan/Description:  Information/Referral to  [employer] , Engineer, mining , No Further Intervention Required/No Barriers to Discharge (CSW will follow infant's cord and will make a report to CPS if needed. )   Laurey Arrow, MSW, LCSW Clinical Social Work [phone]   Dimple Nanas, LCSW 05/09/2016, 3:02 PM

## 2016-05-10 MED ORDER — IBUPROFEN 600 MG PO TABS: 600.0000 mg | ORAL_TABLET | Freq: Four times a day (QID) | ORAL | 0 refills | Status: AC

## 2016-05-10 MED ORDER — ACETAMINOPHEN 325 MG PO TABS: 650.0000 mg | ORAL_TABLET | ORAL | 0 refills | Status: AC | PRN

## 2016-05-10 NOTE — Lactation Note (Signed)
This note was copied from a baby's chart. Lactation Consultation Note  Patient Name: Sheila Villanueva Date: 05/10/2016    RN had provided size 24 nipple shield overnight, but Mom said it did not help. Mom has chosen to pump & BO for the time being. Mom has pumped twice & the last pumping was about 6 hours ago. Mom's breasts are not engorged, but her breasts have some firmer areas, especially on her R breast. I encouraged Mom to pump. On visual inspection, her nipple diameter suggests a 24 flange, which I told mother to use.   Mom is active w/WIC, but we do not have enough loaners available at this time. I called [facility] ([place] office) and they have an appt at 3:30 or 4pm today for her. Mom to call [facility] office if she'd like to procure either of those appts.   Mom was shown how to assemble & use hand pump that was included in pump kit. Mom also has Harmony pump. Paced bottle-feeding discussed w/instructions written. Volume guidelines discussed/written, with emphasis to allow infant to eat more than volumes listed, if he cues to do so.   [facility] outpatient appt made for 2 days from now to see about helping infant to latch.    Sheila Villanueva 05/10/2016, 9:44 AM

## 2016-05-10 NOTE — Plan of Care (Signed)
Problem: Education: Goal: Knowledge of condition will improve Outcome: Completed/Met Date Met: 05/10/16 Discharge education reviewed with patient and father of baby. Patient verbalizes understanding of information.

## 2016-05-10 NOTE — Discharge Instructions (Signed)

## 2016-05-10 NOTE — Progress Notes (Signed)
CSW received a telephone call from bedside nurse requesting transportation assistance family.  CSW met with MOB, FOB, FOB's brother, and a unknown female.  CSW inquired about transportation barriers and MOB communicated that the family did not have a ride home from the hospital. CSW attempted to process transportation options and the unknown female requested a cab voucher.  The unknown female,  communicated that the hospital provided her with a voucher last week and requested one for MOB and FOB.  CSW explained to the family that a cab voucher was not an option and offered the family 2 bus tickets.  FOB's brother and unknown female was not happy with CSW offer as evidence by rolling of the eyes, however, MOB and FOB accepted the bus tickets. MOB and FOB had not other needs or concerns at this time.   Laurey Arrow, MSW, LCSW Clinical Social Work [phone]

## 2016-05-10 NOTE — Discharge Summary (Signed)
OB Discharge Summary     Patient Name: Sheila Villanueva DOB: Jul 19, 1996 MRN: [id number]  Date of admission: 05/08/2016 Delivering MD: Wyvonnia Dusky D   Date of discharge: 05/10/2016  Admitting diagnosis: induction Intrauterine pregnancy: [redacted]w[redacted]d     Secondary diagnosis:  Active Problems:   Personal history of previous postdates pregnancy  Normal vaginal delivery  Additional problems: Abnormal 1 GTT     Discharge diagnosis: Term Pregnancy Delivered                                                                                                Post partum procedures:none  Augmentation: AROM and Pitocin  Complications: None  Hospital course:  Induction of Labor With Vaginal Delivery   20 y.o. yo G1P1001 at [redacted]w[redacted]d was admitted to the hospital 05/08/2016 for induction of labor.  Indication for induction: Postdates.  Patient had an uncomplicated labor course as follows: Membrane Rupture Time/Date: 11:22 AM ,05/08/2016   Intrapartum Procedures: Episiotomy: None [1]                                         Lacerations:  1st degree [2]  Patient had delivery of a Viable infant.  Information for the patient's newborn:  Kylinn, [place] [[id number]]  Delivery Method: Vaginal, Spontaneous Delivery (Filed from Delivery Summary)   05/08/2016  Details of delivery can be found in separate delivery note.  Patient had a routine postpartum course. Patient is discharged home 05/10/16.   Physical exam Vitals:   05/09/16 0200 05/09/16 0535 05/09/16 1900 05/10/16 0645  BP: 135/62 123/66 108/66 110/75  Pulse: 95 96 90 85  Resp: 18 18 18 18   Temp: 99.1 F (37.3 C) 98.8 F (37.1 C) 98.4 F (36.9 C) 98.4 F (36.9 C)  TempSrc: Oral  Oral Oral  SpO2:      Weight:      Height:       General: alert, cooperative and no distress Lochia: appropriate Uterine Fundus: firm Incision: N/A DVT Evaluation: No evidence of DVT seen on physical exam. No significant calf/ankle edema. Labs: Lab  Results  Component Value Date   WBC 8.5 05/08/2016   HGB 10.6 (L) 05/08/2016   HCT 32.2 (L) 05/08/2016   MCV 87.5 05/08/2016   PLT 232 05/08/2016   No flowsheet data found.  Discharge instruction: per After Visit Summary and "Baby and Me Booklet".  After visit meds:    Medication List    TAKE these medications   acetaminophen 325 MG tablet Commonly known as:  TYLENOL Take 2 tablets (650 mg total) by mouth every 4 (four) hours as needed (for pain scale < 4).   ibuprofen 600 MG tablet Commonly known as:  ADVIL,MOTRIN Take 1 tablet (600 mg total) by mouth every 6 (six) hours.       Diet: routine diet  Activity: Advance as tolerated. Pelvic rest for 6 weeks.   Outpatient follow up:6 weeks Follow up Appt:No future appointments. Follow up Visit:No Follow-up on file.  Postpartum contraception: Progesterone only pills  Newborn Data: Live born female  Birth Weight: 8 lb 7.1 oz (3830 g) APGAR: 1, 9  Baby Feeding: Breast Disposition:home with mother   05/10/2016 Sheila PennaNicholas Tysin Salada, MD

## 2016-05-12 ENCOUNTER — Ambulatory Visit (HOSPITAL_COMMUNITY): Admit: 2016-05-12 | Payer: Medicaid Other

## 2016-05-13 ENCOUNTER — Emergency Department (HOSPITAL_COMMUNITY)
Admission: EM | Admit: 2016-05-13 | Discharge: 2016-05-13 | Disposition: A | Discharge status: Home / Self Care | Payer: Medicaid Other | Attending: Emergency Medicine | Admitting: Emergency Medicine

## 2016-05-13 ENCOUNTER — Emergency Department (HOSPITAL_COMMUNITY): Payer: Medicaid Other

## 2016-05-13 ENCOUNTER — Encounter (HOSPITAL_COMMUNITY): Payer: Self-pay | Admitting: Emergency Medicine

## 2016-05-13 DIAGNOSIS — R51 Headache: Secondary | ICD-10-CM | POA: Diagnosis not present

## 2016-05-13 DIAGNOSIS — O9989 Other specified diseases and conditions complicating pregnancy, childbirth and the puerperium: Secondary | ICD-10-CM | POA: Diagnosis present

## 2016-05-13 DIAGNOSIS — R519 Headache, unspecified: Secondary | ICD-10-CM

## 2016-05-13 DIAGNOSIS — Z79899 Other long term (current) drug therapy: Secondary | ICD-10-CM | POA: Diagnosis not present

## 2016-05-13 LAB — CBC WITH DIFFERENTIAL/PLATELET
BASOS ABS: 0 10*3/uL (ref 0.0–0.1)
BASOS PCT: 0 %
Eosinophils Absolute: 0.2 10*3/uL (ref 0.0–0.7)
Eosinophils Relative: 2 %
HEMATOCRIT: 26.8 % — AB (ref 36.0–46.0)
HEMOGLOBIN: 8.6 g/dL — AB (ref 12.0–15.0)
Lymphocytes Relative: 26 %
Lymphs Abs: 2 10*3/uL (ref 0.7–4.0)
MCH: 28.9 pg (ref 26.0–34.0)
MCHC: 32.1 g/dL (ref 30.0–36.0)
MCV: 89.9 fL (ref 78.0–100.0)
MONOS PCT: 6 %
Monocytes Absolute: 0.4 10*3/uL (ref 0.1–1.0)
NEUTROS ABS: 4.9 10*3/uL (ref 1.7–7.7)
NEUTROS PCT: 66 %
Platelets: 272 10*3/uL (ref 150–400)
RBC: 2.98 MIL/uL — ABNORMAL LOW (ref 3.87–5.11)
RDW: 15.5 % (ref 11.5–15.5)
WBC: 7.5 10*3/uL (ref 4.0–10.5)

## 2016-05-13 LAB — BASIC METABOLIC PANEL
ANION GAP: 4 — AB (ref 5–15)
BUN: 10 mg/dL (ref 6–20)
CHLORIDE: 109 mmol/L (ref 101–111)
CO2: 24 mmol/L (ref 22–32)
Calcium: 9.2 mg/dL (ref 8.9–10.3)
Creatinine, Ser: 0.46 mg/dL (ref 0.44–1.00)
GFR calc non Af Amer: >60 mL/min (ref >60)
Glucose, Bld: 96 mg/dL (ref 65–99)
POTASSIUM: 4.2 mmol/L (ref 3.5–5.1)
Sodium: 137 mmol/L (ref 135–145)

## 2016-05-13 MED ORDER — DIPHENHYDRAMINE HCL 50 MG/ML IJ SOLN
25.0000 mg | Freq: Once | INTRAMUSCULAR | Status: AC
  Administered 2016-05-13: 25 mg via INTRAVENOUS
  Filled 2016-05-13: qty 1

## 2016-05-13 MED ORDER — KETOROLAC TROMETHAMINE 30 MG/ML IJ SOLN
30.0000 mg | Freq: Once | INTRAMUSCULAR | Status: AC
  Administered 2016-05-13: 30 mg via INTRAVENOUS
  Filled 2016-05-13: qty 1

## 2016-05-13 MED ORDER — SODIUM CHLORIDE 0.9 % IV BOLUS (SEPSIS)
1000.0000 mL | Freq: Once | INTRAVENOUS | Status: AC
  Administered 2016-05-13: 1000 mL via INTRAVENOUS

## 2016-05-13 MED ORDER — PROCHLORPERAZINE EDISYLATE 5 MG/ML IJ SOLN
10.0000 mg | Freq: Once | INTRAMUSCULAR | Status: AC
  Administered 2016-05-13: 10 mg via INTRAVENOUS
  Filled 2016-05-13: qty 2

## 2016-05-13 NOTE — ED Provider Notes (Signed)
[facility]-EMERGENCY DEPT Provider Note   CSN: [id number] Arrival date & time: 05/13/16  4098     History   Chief Complaint Chief Complaint  Patient presents with  . Headache    x5 days     HPI Sheila Villanueva is a 20 y.o. female.  HPI   20 year old female who presents with left sided headache. Post partum from vaginal delivery October 15. States after delivery 5 days ago developed mild 4-5/10 severity left sided headache. Has been constant, waxing and waning in severity over past 5 days. History of migraines before, but very infrequent. Feels fullness and mild swelling of the right cheek with tingling sensation. Not aggravating by position changes.  No numbness/weakness of arms/legs, ataxia, n/v, photophobia, phonophobia, vision or speech changes, fever or chills. Has not taken any medications to treat pain. Pain currently 6-7/10 in severity.  Past Medical History:  Diagnosis Date  . Anxiety    stopped medication on her own  . Depression   . Dysrhythmia    pt states prolonged QT in past  . Headache   . Seizures (HCC)    reports seizure like activity in July 2017    Patient Active Problem List   Diagnosis Date Noted  . Personal history of previous postdates pregnancy 05/08/2016  . Insufficient prenatal care 04/12/2016  . Chlamydia infection affecting pregnancy 04/12/2016  . Glucose tolerance test abnormal 04/12/2016    History reviewed. No pertinent surgical history.  OB History    Gravida Para Term Preterm AB Living   1 1 1     1    SAB TAB Ectopic Multiple Live Births         0 1       Home Medications    Prior to Admission medications   Medication Sig Start Date End Date Taking? Authorizing Provider  acetaminophen (TYLENOL) 325 MG tablet Take 2 tablets (650 mg total) by mouth every 4 (four) hours as needed (for pain scale < 4). 05/10/16  Yes Lorne Skeens, MD  ibuprofen (ADVIL,MOTRIN) 600 MG tablet Take 1 tablet (600 mg total) by mouth every 6 (six)  hours. 05/10/16  Yes Lorne Skeens, MD    Family History Family History  Problem Relation Age of Onset  . Adopted: Yes    Social History Social History  Substance Use Topics  . Smoking status: Never Smoker  . Smokeless tobacco: Never Used  . Alcohol use No     Allergies   Tetracyclines & related and Sulfa antibiotics   Review of Systems Review of Systems 10/14 systems reviewed and are negative other than those stated in the HPI  Physical Exam Updated Vital Signs BP 122/80 (BP Location: Left Arm)   Pulse 70   Temp 99.4 F (37.4 C) (Oral)   Resp 17   LMP 07/09/2015   SpO2 99%   Physical Exam Physical Exam  Nursing note and vitals reviewed. Constitutional: Well developed, well nourished, non-toxic, and in no acute distress Head: Normocephalic and atraumatic.  Mouth/Throat: Oropharynx is clear and moist.  Neck: Normal range of motion. Neck supple. no meningismus Cardiovascular: Normal rate and regular rhythm.   Pulmonary/Chest: Effort normal and breath sounds normal.  Abdominal: Soft. There is no tenderness. There is no rebound and no guarding.  Musculoskeletal: Normal range of motion.  Skin: Skin is warm and dry.  Psychiatric: Cooperative Neurological:  Alert, oriented to person, place, time, and situation. Memory grossly in tact. Fluent speech. No dysarthria or aphasia.  Cranial nerves: VF are full.  Pupils are symmetric, and reactive to light. EOMI without nystagmus. No gaze deviation. Facial muscles symmetric with activation. Sensation to light touch over face in tact bilaterally. Hearing grossly in tact. Palate elevates symmetrically. Head turn and shoulder shrug are intact. Tongue midline.  Reflexes defered.  Muscle bulk and tone normal. No pronator drift. Moves all extremities symmetrically. Sensation to light touch is in tact throughout in bilateral upper and lower extremities. Coordination reveals no dysmetria with finger to nose. Gait is  narrow-based and steady. Non-ataxic.    ED Treatments / Results  Labs (all labs ordered are listed, but only abnormal results are displayed) Labs Reviewed  CBC WITH DIFFERENTIAL/PLATELET - Abnormal; Notable for the following:       Result Value   RBC 2.98 (*)    Hemoglobin 8.6 (*)    HCT 26.8 (*)    All other components within normal limits  BASIC METABOLIC PANEL - Abnormal; Notable for the following:    Anion gap 4 (*)    All other components within normal limits    EKG  EKG Interpretation None       Radiology Ct Head Wo Contrast  Result Date: 05/13/2016 CLINICAL DATA:  20 year old female with acute headache for 5 days. Recent postpartum EXAM: CT HEAD WITHOUT CONTRAST TECHNIQUE: Contiguous axial images were obtained from the base of the skull through the vertex without intravenous contrast. COMPARISON:  None. FINDINGS: Brain: No intracranial abnormalities are identified, including mass lesion or mass effect, hydrocephalus, extra-axial fluid collection, midline shift, hemorrhage, or infarction. Vascular: No hyperdense vessel or unexpected calcification. Skull: Normal. Negative for fracture or focal lesion. Sinuses/Orbits: No acute finding. Other: None. IMPRESSION: Normal noncontrast head CT. Electronically Signed   By: Harmon PierJeffrey  Hu M.D.   On: 05/13/2016 09:25    Procedures Procedures (including critical care time)  Medications Ordered in ED Medications  sodium chloride 0.9 % bolus 1,000 mL (0 mLs Intravenous Stopped 05/13/16 1131)  ketorolac (TORADOL) 30 MG/ML injection 30 mg (30 mg Intravenous Given 05/13/16 0929)  prochlorperazine (COMPAZINE) injection 10 mg (10 mg Intravenous Given 05/13/16 0923)  diphenhydrAMINE (BENADRYL) injection 25 mg (25 mg Intravenous Given 05/13/16 [id number])     Initial Impression / Assessment and Plan / ED Course  I have reviewed the triage vital signs and the nursing notes.  Pertinent labs & imaging results that were available during my care of  the patient were reviewed by me and considered in my medical decision making (see chart for details).  Clinical Course   20 year old females 5 days post-partum who presents with left sided headache, atypical for her. She is in no acute distress, well appearing. Vitals within normal limits. She has normal neuro exam on my evaluation. No appreciable facial swelling. CT head visualized and negative for acute intracranial processes such as large mass or hemorrhage. History and exam not concerning at this time for other serious intracranial processes such as SAH, meningitis or other infections. Higher risk for coagulability with recent pregnancy and concerned dural venous sinus thrombosis. However, after migraine cocktail, including IVF, toradol, compazine, she has no headache. Exam remains benign. At this time I do not think further MRI/MRV imaging warranted. Discussed strict return instructions and reviewed symptoms that should bring her back to ED for re-evaluation and further imaging. She was comfortable with discharge. Expressed understanding of all discharge instructions.    Final Clinical Impressions(s) / ED Diagnoses   Final diagnoses:  Acute nonintractable headache, unspecified  headache type    New Prescriptions Discharge Medication List as of 05/13/2016 11:15 AM       Lavera Guise, MD 05/13/16 [phone]

## 2016-05-13 NOTE — ED Notes (Signed)
MD at bedside. 

## 2016-05-13 NOTE — ED Notes (Signed)
Patient asked for a cab voucher, I checked we did not have any vouchers or bus passes. Patient called for [employer] to get ride home.

## 2016-05-13 NOTE — Discharge Instructions (Signed)
You can continue tylenol and ibuprofen at home for headache.  Throw away the breast milk that you pump for the next 24 hours.  Return for worsening symptoms, including recurrent worsening headache, facial swelling, vision or speech changes, numbness or weakness, intractable vomiting or any other symptoms concerning to you.

## 2016-05-13 NOTE — ED Notes (Signed)
Patient transported to Ct. Patient wanted to wait until returning from CT to get medications.

## 2016-05-13 NOTE — ED Triage Notes (Signed)
Pt comes to ed, via ems, c/o is headache lasting past 5 days. Had a baby on the 15th, oct. No complications noted. Describes constant pressure on her head pain 4 out 10. Verbalizes facial edema, numbness on her facial right side. [place], [address], [place]. [phone].   Allergies to sulfa, denies medications.  V/s on arrival 125/77, pulse 82, rr16, 98 room air, cbg 111.

## 2016-05-13 NOTE — ED Notes (Signed)
Patient transported to CT 

## 2016-05-13 NOTE — ED Notes (Signed)
Bed: [id number] Expected date:  Expected time:  Means of arrival:  Comments: EMS 20 yo female with migraine

## 2016-05-16 ENCOUNTER — Telehealth (HOSPITAL_COMMUNITY): Payer: Self-pay | Admitting: Lactation Services

## 2016-05-16 NOTE — Telephone Encounter (Signed)
Mom called in request LC appt . LC scheduled mom for Friday 10/27 at 2:30 .  Per mom only pumping every 2-3 hours around the clock and baby taking a bottle. 2 oz at a time. Mom denies engorgement.  LC gave mom directions and what to bring to consult.

## 2016-05-20 ENCOUNTER — Ambulatory Visit (HOSPITAL_COMMUNITY): Admission: RE | Admit: 2016-05-20 | Payer: Medicaid Other | Source: Ambulatory Visit

## 2016-05-27 ENCOUNTER — Ambulatory Visit (HOSPITAL_COMMUNITY): Payer: Medicaid Other

## 2016-05-27 ENCOUNTER — Encounter (HOSPITAL_COMMUNITY)
Admission: RE | Admit: 2016-05-27 | Discharge: 2016-05-27 | Disposition: A | Discharge status: Home / Self Care | Payer: Medicaid Other | Source: Ambulatory Visit | Attending: Obstetrics & Gynecology | Admitting: Obstetrics & Gynecology

## 2016-05-27 NOTE — Lactation Note (Signed)
Lactation Consult  Mother's reason for visit:  Want to get baby to latch and also increase milk volume. Visit Type:  OP Appointment Notes:  Enrique SackKendra reports that she initially was expressing 8-10 oz of milk every 2-3 hours but that it just went away. After further discussion suspect that she was increasing length of time between sessions which led to decreased milk supply.  She also reports that she wants Sheila to BF because formula is expensive and she dislikes pumping.  Many attempts were made to attach Sheila to the breast today including initiation of #24 nipple shield. He briefly attached with it NS. He does not bring his tongue down and out to latch and he also sucks on his tongue and his upper lip. He bites at times Explained to parents that they would have to keep working with him. Recommended discontinue pacifier for now.  Gave mother tongue exercises for baby and strategies for pumping that would fit with her lifestyle. He is currently being fed 4 oz of formula at every feeding.   Consult:  Initial Lactation Consultant:  Soyla DryerJoseph, Therron Sells  ________________________________________________________________________ Joan FloresBaby's Name:  Sheila Villanueva MustBecton Jr. Date of Birth:  05/08/2017 Pediatrician:  Delfino LovettEsther Smith Gender:  female Gestational Age: 6078w0d (At Birth) Birth Weight:  8 lb 7.1 oz (3830 g) Weight at Discharge:  Weight: 8 lb 1.1 oz (3660 g)               [facility]  [facility]   05/08/16 1802 05/09/16 2337  Weight: 8 lb 7.1 oz (3830 g) 8 lb 1.1 oz (3660 g)   Weight today:  8#9.2 oz     ________________________________________________________________________  Mother's Name: Holland FallingKendra E Godina Type of delivery:   Breastfeeding Experience:  P1 Maternal Medications:  Occasional use of Excedrin migraine. 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  ________________________________________________________________________   Voids:  6+ in 24 hrs.  Color:  Clear yellow Stools:  3+ in 24 hrs.  Color:  Yellow

## 2016-06-20 ENCOUNTER — Ambulatory Visit: Payer: Medicaid Other | Admitting: Certified Nurse Midwife

## 2016-06-25 ENCOUNTER — Emergency Department (HOSPITAL_COMMUNITY): Payer: Medicaid Other

## 2016-06-25 ENCOUNTER — Emergency Department (HOSPITAL_COMMUNITY)
Admission: EM | Admit: 2016-06-25 | Discharge: 2016-06-26 | Disposition: A | Discharge status: Home / Self Care | Payer: Medicaid Other | Attending: Emergency Medicine | Admitting: Emergency Medicine

## 2016-06-25 ENCOUNTER — Encounter (HOSPITAL_COMMUNITY): Payer: Self-pay | Admitting: *Deleted

## 2016-06-25 DIAGNOSIS — J181 Lobar pneumonia, unspecified organism: Secondary | ICD-10-CM | POA: Insufficient documentation

## 2016-06-25 DIAGNOSIS — J189 Pneumonia, unspecified organism: Secondary | ICD-10-CM

## 2016-06-25 DIAGNOSIS — R05 Cough: Secondary | ICD-10-CM | POA: Diagnosis present

## 2016-06-25 LAB — I-STAT CHEM 8, ED
BUN: 8 mg/dL (ref 6–20)
CHLORIDE: 105 mmol/L (ref 101–111)
Calcium, Ion: 1.2 mmol/L (ref 1.15–1.40)
Creatinine, Ser: 0.8 mg/dL (ref 0.44–1.00)
GLUCOSE: 93 mg/dL (ref 65–99)
HCT: 38 % (ref 36.0–46.0)
Hemoglobin: 12.9 g/dL (ref 12.0–15.0)
POTASSIUM: 3.8 mmol/L (ref 3.5–5.1)
Sodium: 141 mmol/L (ref 135–145)
TCO2: 27 mmol/L (ref 0–100)

## 2016-06-25 LAB — CBC WITH DIFFERENTIAL/PLATELET
BASOS ABS: 0 10*3/uL (ref 0.0–0.1)
Basophils Relative: 0 %
Eosinophils Absolute: 0.1 10*3/uL (ref 0.0–0.7)
Eosinophils Relative: 1 %
HEMATOCRIT: 38.9 % (ref 36.0–46.0)
Hemoglobin: 12.4 g/dL (ref 12.0–15.0)
LYMPHS ABS: 2.9 10*3/uL (ref 0.7–4.0)
Lymphocytes Relative: 29 %
MCH: 28.1 pg (ref 26.0–34.0)
MCHC: 31.9 g/dL (ref 30.0–36.0)
MCV: 88 fL (ref 78.0–100.0)
MONO ABS: 0.5 10*3/uL (ref 0.1–1.0)
MONOS PCT: 5 %
NEUTROS PCT: 65 %
Neutro Abs: 6.5 10*3/uL (ref 1.7–7.7)
PLATELETS: 261 10*3/uL (ref 150–400)
RBC: 4.42 MIL/uL (ref 3.87–5.11)
RDW: 14.8 % (ref 11.5–15.5)
WBC: 10 10*3/uL (ref 4.0–10.5)

## 2016-06-25 LAB — I-STAT BETA HCG BLOOD, ED (MC, WL, AP ONLY)

## 2016-06-25 LAB — D-DIMER, QUANTITATIVE (NOT AT ARMC): D DIMER QUANT: 0.55 ug{FEU}/mL — AB (ref 0.00–0.50)

## 2016-06-25 MED ORDER — ACETAMINOPHEN 325 MG PO TABS
650.0000 mg | ORAL_TABLET | Freq: Once | ORAL | Status: AC
  Administered 2016-06-25: 650 mg via ORAL
  Filled 2016-06-25: qty 2

## 2016-06-25 MED ORDER — KETOROLAC TROMETHAMINE 60 MG/2ML IM SOLN
30.0000 mg | Freq: Once | INTRAMUSCULAR | Status: DC
  Filled 2016-06-25: qty 2

## 2016-06-25 MED ORDER — KETOROLAC TROMETHAMINE 30 MG/ML IJ SOLN
30.0000 mg | Freq: Once | INTRAMUSCULAR | Status: AC
  Administered 2016-06-25: 30 mg via INTRAVENOUS
  Filled 2016-06-25: qty 1

## 2016-06-25 MED ORDER — PROMETHAZINE-DM 6.25-15 MG/5ML PO SYRP
5.0000 mL | ORAL_SOLUTION | Freq: Four times a day (QID) | ORAL | 0 refills | Status: AC | PRN
Start: 2016-06-25 — End: ?

## 2016-06-25 MED ORDER — AZITHROMYCIN 250 MG PO TABS: 250.0000 mg | ORAL_TABLET | Freq: Every day | ORAL | 0 refills | Status: AC

## 2016-06-25 MED ORDER — IOPAMIDOL (ISOVUE-370) INJECTION 76%
INTRAVENOUS | Status: AC
  Administered 2016-06-25: 80 mL
  Filled 2016-06-25: qty 100

## 2016-06-25 MED ORDER — BENZONATATE 100 MG PO CAPS: 100.0000 mg | ORAL_CAPSULE | Freq: Three times a day (TID) | ORAL | 0 refills | Status: AC

## 2016-06-25 NOTE — Discharge Instructions (Signed)
Take zithromax as prescribed until all gone. Take Tessalon for cough. Take phenergan guaifenisin for cough as needed, do not drive if taking. Follow up with a family doctor. Return if worsening.

## 2016-06-25 NOTE — ED Notes (Signed)
Pt requesting cab voucher. Pt informed that Charge RN stated we do not give out cab vouchers. Pt stated she had no one to call for a ride home.

## 2016-06-25 NOTE — ED Triage Notes (Signed)
Pt reports onset yesterday of cough, mid back pain and chest pain when she coughs/breaths. Also reports having a headache. No acute distress noted at triage. Denies fever.

## 2016-06-25 NOTE — ED Provider Notes (Signed)
[facility]-EMERGENCY DEPT Provider Note   CSN: [id number] Arrival date & time: 06/25/16  1826     History   Chief Complaint Chief Complaint  Patient presents with  . Back Pain  . Headache  . Cough    HPI Sheila Villanueva is a 20 y.o. female.  HPI Sheila Villanueva is a 20 y.o. female with history of anxiety, seizures, depression, presents to emergency department complaining of left mid back pain, chest pain, cough, shortness of breath. Patient states that her cough and congestion started about a week ago. She states her pain started yesterday. She reports pain in the mid back on the left side around her scapula that is worsened with laying down flat and deep breathing. This pain is sharp. She states she hasn't been able to sleep yesterday, and had to try to sleep sitting up. She states that the chest pain she is feeling is more of a pressure-like. Reports it's across the chest. She reports productive cough, intermittent wheezing. She denies any definite fever or chills. She has not tried any medications prior to coming in. He reports associated headache, no nausea, vomiting, diarrhea, and no urinary symptoms, no abdominal pain. States that she does not take any birth control at this time, no recent travel, no surgeries, denies any pain or swelling in her legs. Denies pregnancy.  Past Medical History:  Diagnosis Date  . Anxiety    stopped medication on her own  . Depression   . Dysrhythmia    pt states prolonged QT in past  . Headache   . Seizures (HCC)    reports seizure like activity in July 2017    Patient Active Problem List   Diagnosis Date Noted  . Personal history of previous postdates pregnancy 05/08/2016  . Insufficient prenatal care 04/12/2016  . Chlamydia infection affecting pregnancy 04/12/2016  . Glucose tolerance test abnormal 04/12/2016    History reviewed. No pertinent surgical history.  OB History    Gravida Para Term Preterm AB Living   1 1 1     1    SAB TAB  Ectopic Multiple Live Births         0 1       Home Medications    Prior to Admission medications   Medication Sig Start Date End Date Taking? Authorizing Provider  acetaminophen (TYLENOL) 325 MG tablet Take 2 tablets (650 mg total) by mouth every 4 (four) hours as needed (for pain scale < 4). 05/10/16   Lorne SkeensNicholas Michael Schenk, MD  ibuprofen (ADVIL,MOTRIN) 600 MG tablet Take 1 tablet (600 mg total) by mouth every 6 (six) hours. 05/10/16   Lorne SkeensNicholas Michael Schenk, MD    Family History Family History  Problem Relation Age of Onset  . Adopted: Yes    Social History Social History  Substance Use Topics  . Smoking status: Never Smoker  . Smokeless tobacco: Never Used  . Alcohol use No     Allergies   Tetracyclines & related and Sulfa antibiotics   Review of Systems Review of Systems  Constitutional: Negative for chills and fever.  Respiratory: Positive for cough, chest tightness and shortness of breath.   Cardiovascular: Positive for chest pain. Negative for palpitations and leg swelling.  Gastrointestinal: Negative for abdominal pain, diarrhea, nausea and vomiting.  Genitourinary: Negative for dysuria, flank pain and pelvic pain.  Musculoskeletal: Positive for back pain. Negative for arthralgias, myalgias, neck pain and neck stiffness.  Skin: Negative for rash.  Neurological: Negative for dizziness, weakness  and headaches.  All other systems reviewed and are negative.    Physical Exam Updated Vital Signs BP 129/78 (BP Location: Left Arm)   Pulse 89   Temp 99.4 F (37.4 C) (Oral)   Resp 18   LMP 06/20/2016   SpO2 100%   Physical Exam  Constitutional: She appears well-developed and well-nourished. No distress.  HENT:  Head: Normocephalic.  Eyes: Conjunctivae are normal.  Neck: Neck supple.  Cardiovascular: Normal rate, regular rhythm and normal heart sounds.   Pulmonary/Chest: Effort normal and breath sounds normal. No respiratory distress. She has no  wheezes. She has no rales.  Abdominal: Soft. Bowel sounds are normal. She exhibits no distension. There is no tenderness. There is no rebound.  Musculoskeletal: She exhibits no edema.  Neurological: She is alert.  Skin: Skin is warm and dry.  Psychiatric: She has a normal mood and affect. Her behavior is normal.  Nursing note and vitals reviewed.    ED Treatments / Results  Labs (all labs ordered are listed, but only abnormal results are displayed) Labs Reviewed  D-DIMER, QUANTITATIVE (NOT AT [facility]) - Abnormal; Notable for the following:       Result Value   D-Dimer, Quant 0.55 (*)    All other components within normal limits  CBC WITH DIFFERENTIAL/PLATELET  URINALYSIS, ROUTINE W REFLEX MICROSCOPIC (NOT AT [facility])  I-STAT CHEM 8, ED  I-STAT BETA HCG BLOOD, ED ([facility], [facility], [facility] ONLY)    EKG  EKG Interpretation None       Radiology Dg Chest 2 View  Result Date: 06/25/2016 CLINICAL DATA:  Cough with mid back pain and chest pain EXAM: CHEST  2 VIEW COMPARISON:  None. FINDINGS: Ill-defined slightly nodular appearing opacity within the right upper lobe. No pleural effusion. Heart size normal. No pneumothorax. IMPRESSION: Ill-defined slightly nodular appearing opacity in the right upper lobe could represent a small infiltrate. Radiographic follow-up is suggested. Electronically Signed   By: Jasmine PangKim  Fujinaga M.D.   On: 06/25/2016 19:15    Procedures Procedures (including critical care time)  Medications Ordered in ED Medications  acetaminophen (TYLENOL) tablet 650 mg (650 mg Oral Given 06/25/16 2153)  ketorolac (TORADOL) 30 MG/ML injection 30 mg (30 mg Intravenous Given 06/25/16 2153)  iopamidol (ISOVUE-370) 76 % injection (80 mLs  Contrast Given 06/25/16 2246)     Initial Impression / Assessment and Plan / ED Course  I have reviewed the triage vital signs and the nursing notes.  Pertinent labs & imaging results that were available during my care of the patient were reviewed by me and  considered in my medical decision making (see chart for details).  Clinical Course     Patient in emergency department with cough, chest pressure, shortness of breath, left sided pleuritic pain. Vital signs are normal. She is not hypoxic or tachycardic. No risk factors for PE, will get a d-dimer. Chest x-ray showing right upper lobe pneumonia. Will get basic lab tests. We'll try Toradol and Tylenol.  11:13 PM D-dimer was slightly elevated, CT angios of the chest was obtained and is negative for PE. CT of the chest did confirm right upper lobe pneumonia. Will start on Zithromax. Will treat cough with Tessalon and Phenergan DM. Will have her follow-up with her family doctor. Return precautions discussed.  Vitals:   06/25/16 1856 06/25/16 2115  BP: 129/78 105/66  Pulse: 89 95  Resp: 18   Temp: 99.4 F (37.4 C)   TempSrc: Oral   SpO2: 100% 100%  Final Clinical Impressions(s) / ED Diagnoses   Final diagnoses:  Community acquired pneumonia of right upper lobe of lung (HCC)    New Prescriptions New Prescriptions   AZITHROMYCIN (ZITHROMAX) 250 MG TABLET    Take 1 tablet (250 mg total) by mouth daily. Take first 2 tablets together, then 1 every day until finished.   BENZONATATE (TESSALON) 100 MG CAPSULE    Take 1 capsule (100 mg total) by mouth every 8 (eight) hours.   PROMETHAZINE-DEXTROMETHORPHAN (PROMETHAZINE-DM) 6.25-15 MG/5ML SYRUP    Take 5 mLs by mouth 4 (four) times daily as needed for cough.     Jaynie Crumble, PA-C 06/25/16 2314    Doug Sou, MD 06/25/16 [phone]

## 2016-07-05 ENCOUNTER — Ambulatory Visit: Payer: Medicaid Other | Admitting: Advanced Practice Midwife

## 2018-07-10 IMAGING — CT CT ANGIO CHEST
2 of 7 series · 18 of 36 positions shown · IV contrast (Omni 300)
Comparison: Current chest radiographs

CLINICAL DATA: Chest pain shortness of breath.

EXAM:
CT ANGIOGRAPHY CHEST WITH CONTRAST
TECHNIQUE: Multidetector CT imaging of the chest was performed using the
standard protocol during bolus administration of intravenous
contrast. Multiplanar CT image reconstructions and MIPs were
obtained to evaluate the vascular anatomy.
CONTRAST:  80 mL of Isovue 370 intravenous contrast

[Series 7: pe thins · axial · 0.62mm/px · z∈[-414,-191]mm · 17 of 251 slices shown]
[im 14/251  lung]
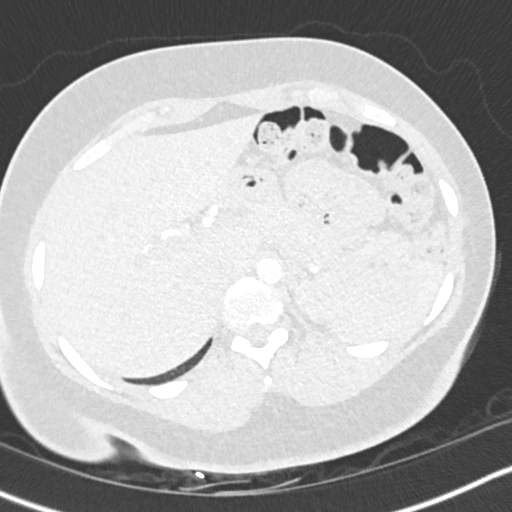
[im 28/251  mediastinal]
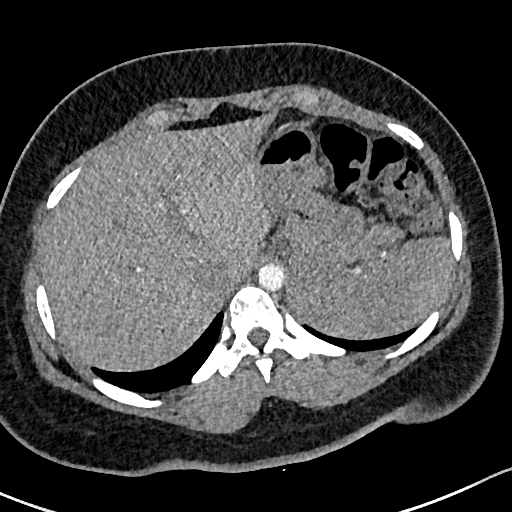
[im 42/251  lung]
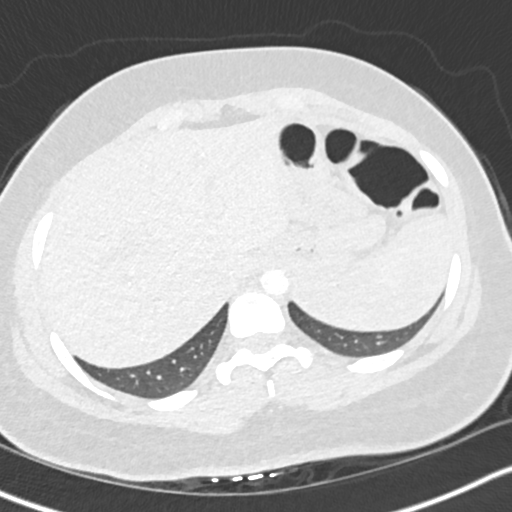
[im 56/251  mediastinal]
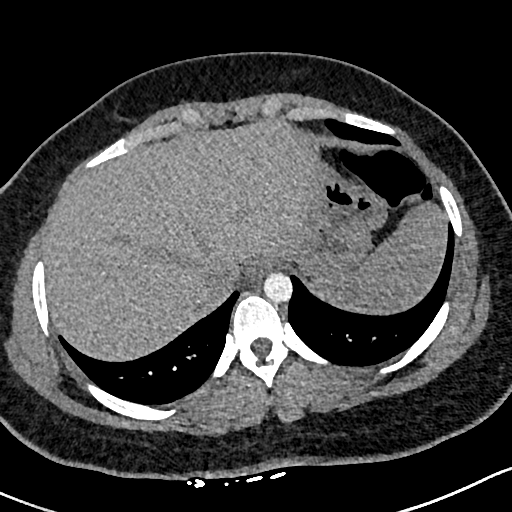
[im 70/251  lung]
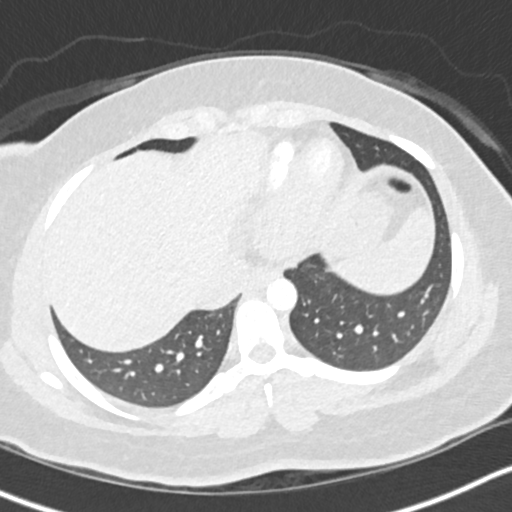
[im 84/251  mediastinal]
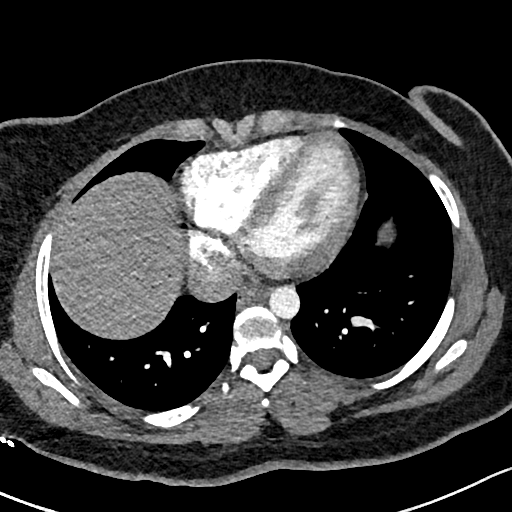
[im 98/251  lung]
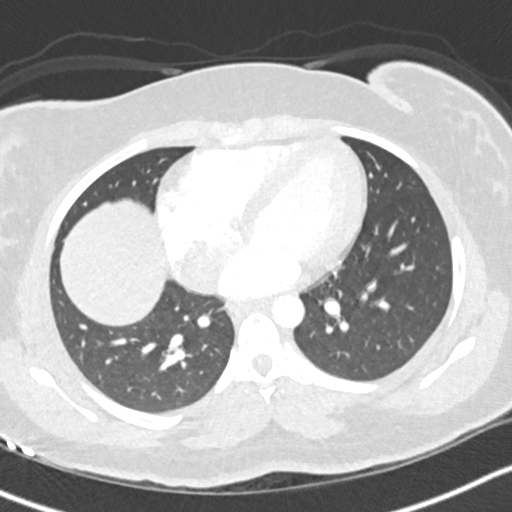
[im 112/251  mediastinal]
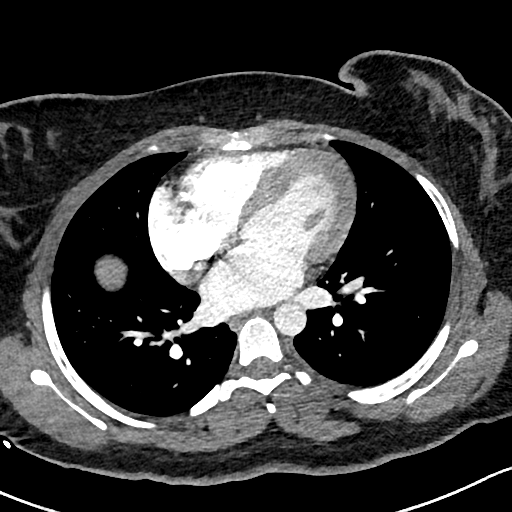
[im 126/251  lung]
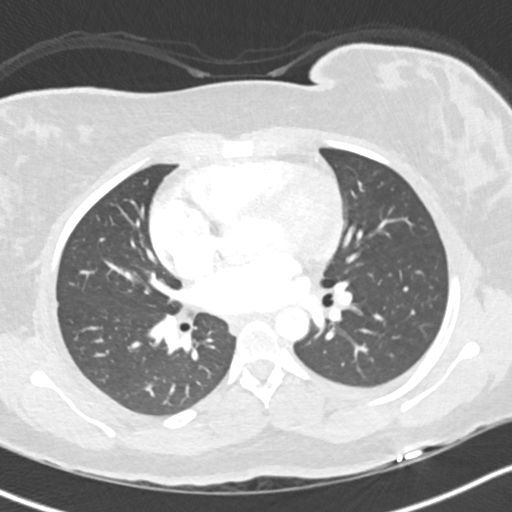
[im 139/251  mediastinal]
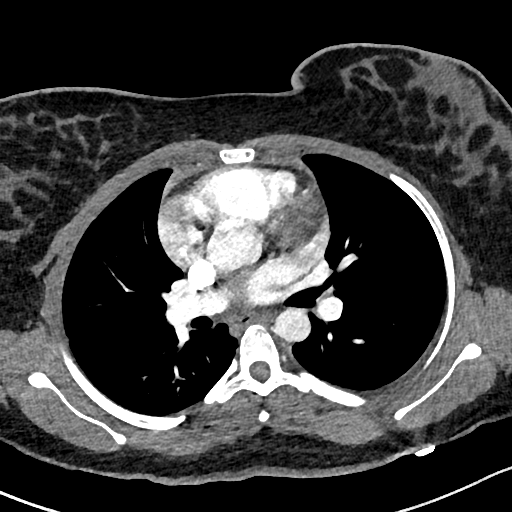
[im 153/251  lung]
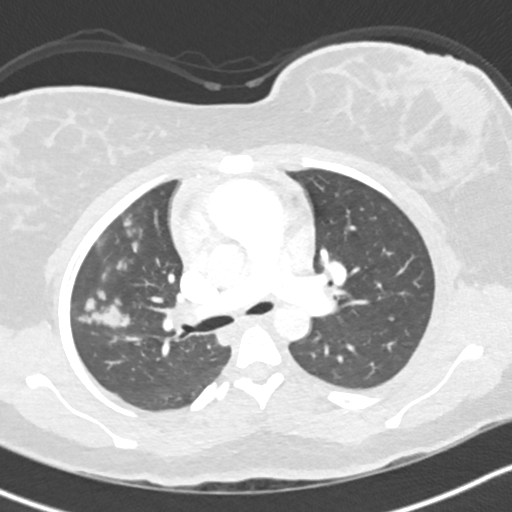
[im 167/251  mediastinal]
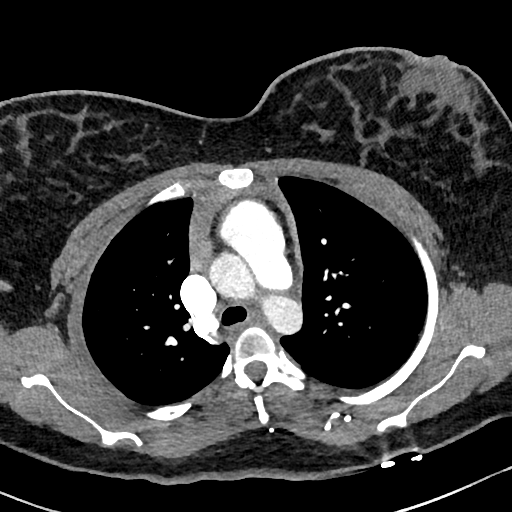
[im 181/251  lung]
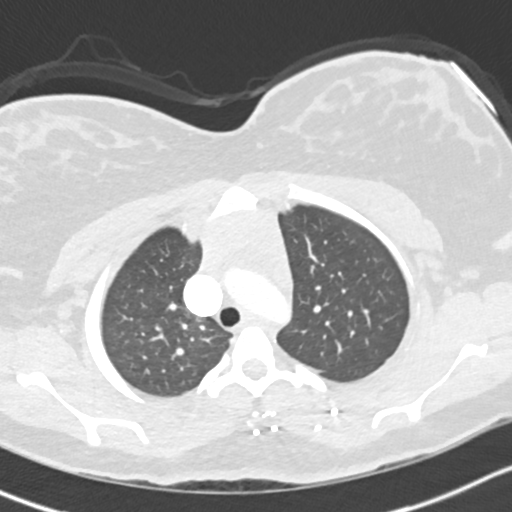
[im 195/251  mediastinal]
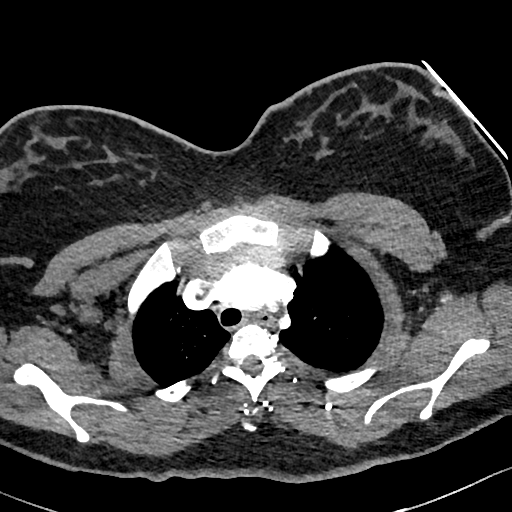
[im 209/251  lung]
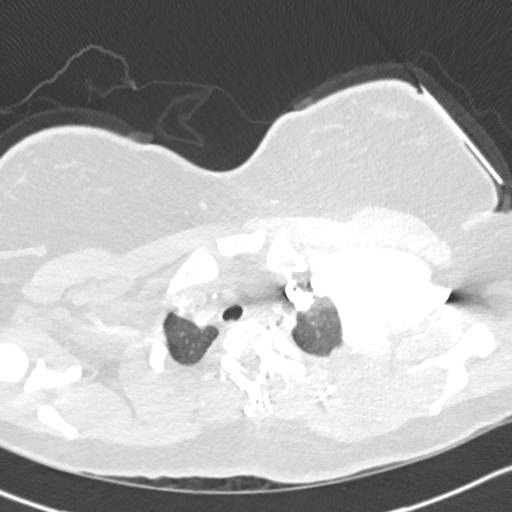
[im 223/251  mediastinal]
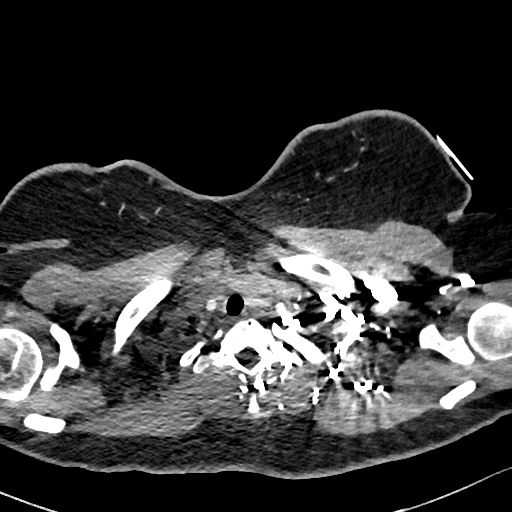
[im 237/251  lung]
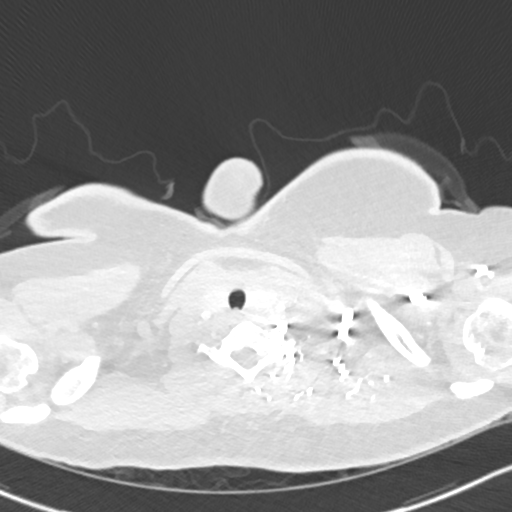

[Series 8: pe 2mm cor · coronal · 0.53mm/px · 1 of 115 slices shown]
[im 58/115  mediastinal]
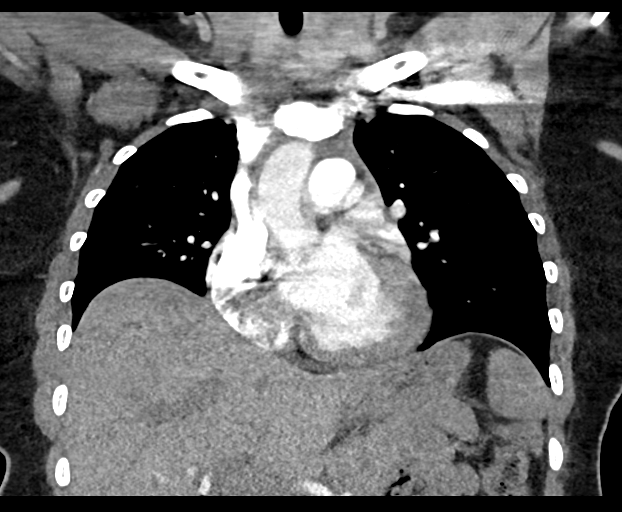

[18 of 36 positions shown; findings below may reference images not displayed]

FINDINGS: Cardiovascular: Satisfactory opacification of the pulmonary arteries
to the segmental level. No evidence of pulmonary embolism. Normal
heart size. No pericardial effusion. The great vessels are normal in
caliber. No aortic dissection or plaque. No coronary artery
calcifications.

Mediastinum/Nodes: No enlarged mediastinal, hilar, or axillary lymph
nodes. Thyroid gland, trachea, and esophagus demonstrate no
significant findings.

Lungs/Pleura: There multiple ill-defined nodular opacities with
intervening hazy airspace opacity in the right upper lobe. Remainder
of the lungs is clear. No pleural effusion. No pneumothorax.

Upper Abdomen: No acute abnormality.

Musculoskeletal: Unremarkable

Review of the MIP images confirms the above findings.
IMPRESSION: 1. No evidence of a pulmonary embolus.
2. Multiple ill-defined nodules in the right upper lobe with
intervening airspace opacity. This is most likely infectious in
origin.
3. No other abnormalities.
